# Patient Record
Sex: Male | Born: 1950 | Race: White | Hispanic: No | Marital: Married | Smoking: Former smoker
Health system: Southern US, Community
[De-identification: ages and names within clinical notes are randomized; demographics above are authoritative.]

## PROBLEM LIST (undated history)

## (undated) DIAGNOSIS — N400 Enlarged prostate without lower urinary tract symptoms: Secondary | ICD-10-CM

## (undated) DIAGNOSIS — K648 Other hemorrhoids: Secondary | ICD-10-CM

## (undated) DIAGNOSIS — E7800 Pure hypercholesterolemia, unspecified: Secondary | ICD-10-CM

## (undated) DIAGNOSIS — E039 Hypothyroidism, unspecified: Secondary | ICD-10-CM

## (undated) DIAGNOSIS — E079 Disorder of thyroid, unspecified: Secondary | ICD-10-CM

## (undated) DIAGNOSIS — F419 Anxiety disorder, unspecified: Secondary | ICD-10-CM

## (undated) DIAGNOSIS — D126 Benign neoplasm of colon, unspecified: Secondary | ICD-10-CM

## (undated) HISTORY — PX: SIGMOIDOSCOPY: SUR1295

## (undated) HISTORY — PX: COLONOSCOPY: SHX174

## (undated) HISTORY — DX: Benign prostatic hyperplasia without lower urinary tract symptoms: N40.0

## (undated) HISTORY — PX: VASECTOMY: SHX75

## (undated) HISTORY — DX: Benign neoplasm of colon, unspecified: D12.6

## (undated) HISTORY — PX: TONSILLECTOMY: SUR1361

## (undated) HISTORY — DX: Anxiety disorder, unspecified: F41.9

## (undated) HISTORY — DX: Hypothyroidism, unspecified: E03.9

## (undated) HISTORY — DX: Other hemorrhoids: K64.8

---
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
## 2017-06-17 HISTORY — PX: HEMORRHOID BANDING: SHX5850

## 2019-06-12 ENCOUNTER — Other Ambulatory Visit: Payer: Self-pay

## 2019-06-12 ENCOUNTER — Ambulatory Visit (HOSPITAL_COMMUNITY)
Admission: EM | Admit: 2019-06-12 | Discharge: 2019-06-12 | Disposition: A | Discharge status: Home / Self Care | Payer: Medicare Other

## 2019-06-12 ENCOUNTER — Encounter (HOSPITAL_COMMUNITY): Payer: Self-pay | Admitting: *Deleted

## 2019-06-12 DIAGNOSIS — W101XXA Fall (on)(from) sidewalk curb, initial encounter: Secondary | ICD-10-CM | POA: Diagnosis not present

## 2019-06-12 DIAGNOSIS — M79642 Pain in left hand: Secondary | ICD-10-CM

## 2019-06-12 DIAGNOSIS — S61412A Laceration without foreign body of left hand, initial encounter: Secondary | ICD-10-CM | POA: Diagnosis not present

## 2019-06-12 HISTORY — DX: Pure hypercholesterolemia, unspecified: E78.00

## 2019-06-12 NOTE — ED Triage Notes (Signed)
Reports tripping on curb 12/24, causing laceration to left hand.  States has kept original dressing in place since incident.  No active bleeding.  No S/S infection.  Left hand CMS intact.

## 2019-06-12 NOTE — ED Provider Notes (Signed)
[place]   MRN: [id number] DOB: 1951-04-26  Subjective:   Rodney Mcdaniel is a 68 y.o. male presenting for suffering a left hand laceration.  Patient states that he tripped on a curb accidentally on 06/10/2019.  He cannot recall how he injured his hand but suffered a laceration.  He has been using Steri-Strips and keeping his hand very clean.  Unfortunately he cannot stop the bleeding and feels like the laceration keeps coming apart.  Reports that his Tdap was updated 1 year ago.  Has not been taking anything for pain.  Denies fever, redness, swelling, drainage of pus, loss of range of motion.  No current facility-administered medications for this encounter.  Current Outpatient Medications:  .  ATORVASTATIN CALCIUM PO, Take by mouth., Disp: , Rfl:  .  LEVOTHYROXINE SODIUM PO, Take by mouth., Disp: , Rfl:    No Known Allergies  Past Medical History:  Diagnosis Date  . Hypercholesteremia   . Thyroid disease      History reviewed. No pertinent surgical history.  Family History  Problem Relation Age of Onset  . Heart disease Mother   . Heart disease Father     Social History   Tobacco Use  . Smoking status: Former Research scientist (life sciences)  . Smokeless tobacco: Never Used  Substance Use Topics  . Alcohol use: Yes    Comment: 3-4 drinks per week  . Drug use: Never    ROS   Objective:   Vitals: BP (!) 169/89   Pulse 66   Temp 97.8 F (36.6 C) (Oral)   Resp 18   SpO2 95%   Physical Exam Constitutional:      General: He is not in acute distress.    Appearance: Normal appearance. He is well-developed and normal weight. He is not ill-appearing, toxic-appearing or diaphoretic.  HENT:     Head: Normocephalic and atraumatic.     Right Ear: External ear normal.     Left Ear: External ear normal.     Nose: Nose normal.     Mouth/Throat:     Pharynx: Oropharynx is clear.  Eyes:     General: No scleral icterus.       Right eye: No discharge.        Left eye: No discharge.      Extraocular Movements: Extraocular movements intact.     Pupils: Pupils are equal, round, and reactive to light.  Cardiovascular:     Rate and Rhythm: Normal rate.  Pulmonary:     Effort: Pulmonary effort is normal.  Musculoskeletal:     Left hand: Laceration and tenderness (minimal about his wound only) present. No swelling, deformity or bony tenderness. Normal range of motion. Normal strength. Normal sensation. Normal capillary refill.       Arms:     Cervical back: Normal range of motion.  Neurological:     Mental Status: He is alert and oriented to person, place, and time.  Psychiatric:        Mood and Affect: Mood normal.        Behavior: Behavior normal.        Thought Content: Thought content normal.        Judgment: Judgment normal.     PROCEDURE NOTE: laceration repair Verbal consent obtained from patient.  Local anesthesia with 4cc Lidocaine 2% with epinephrine.  Wound explored for tendon, ligament damage. Wound scrubbed with soap and water and rinsed. Wound closed with #3 4-0 Prolene (loosely approximated with simple interrupted) sutures.  Wound cleansed and dressed.   Assessment and Plan :   1. Laceration of left hand without foreign body, initial encounter   2. Left hand pain     Laceration loosely approximated successfully. Wound care reviewed.  Patient is to return in 2 to 3 days for wound check unless signs and symptoms of infection develop.  No need to update Tdap today.  Remove sutures in about 10 days.  Counseled patient on potential for adverse effects with medications prescribed/recommended today, ER and return-to-clinic precautions discussed, patient verbalized understanding.    Jaynee Eagles, PA-C 06/12/19 1251

## 2019-06-12 NOTE — Discharge Instructions (Signed)
For tonight, change your dressing before going to bed.  Use nonadherent dressing with coband as provided.  Please change your dressing 2-3 times daily until you are seen again in 2 to 3 days. Do not apply any ointments or creams. Each time you change your dressing, make sure you clean gently around the perimeter of the wound with gentle soap and warm water. Pat your wound dry and let it air out if possible for 1-2 hours before reapplying another dressing.  If you develop redness, swelling, drainage of pus or worsening pain then please come back to the clinic as soon as possible as this is a sign of an infected wound.  You may take Tylenol at 500 mg to 650 mg once every 6-8 hours for pain.

## 2019-06-14 ENCOUNTER — Other Ambulatory Visit: Payer: Self-pay

## 2019-06-14 ENCOUNTER — Encounter (HOSPITAL_COMMUNITY): Payer: Self-pay

## 2019-06-14 ENCOUNTER — Ambulatory Visit (HOSPITAL_COMMUNITY)
Admission: EM | Admit: 2019-06-14 | Discharge: 2019-06-14 | Disposition: A | Discharge status: Home / Self Care | Payer: Medicare Other | Attending: Family Medicine | Admitting: Family Medicine

## 2019-06-14 DIAGNOSIS — Z5189 Encounter for other specified aftercare: Secondary | ICD-10-CM

## 2019-06-14 NOTE — ED Triage Notes (Signed)
Pt. States he is following up for his left hand wound from 06/12/2019.

## 2019-06-14 NOTE — ED Notes (Signed)
Bed: UC04 Expected date:  Expected time:  Means of arrival:  Comments: 10:00 Appt

## 2019-06-14 NOTE — ED Provider Notes (Signed)
[place]    CSN: [id number] Arrival date & time: 06/14/19  [id number]      History   Chief Complaint Chief Complaint  Patient presents with  . Appointment    HAND WOUND(LEFT)    HPI Rodney Mcdaniel is a 68 y.o. male.   HPI  Here for follow up  hand laceration No complaint Keeping covered No redness, pain or drainage  Past Medical History:  Diagnosis Date  . Hypercholesteremia   . Thyroid disease     There are no problems to display for this patient.   History reviewed. No pertinent surgical history.     Home Medications    Prior to Admission medications   Medication Sig Start Date End Date Taking? Authorizing Provider  ATORVASTATIN CALCIUM PO Take by mouth.    [provider]  LEVOTHYROXINE SODIUM PO Take by mouth.    [provider]    Family History Family History  Problem Relation Age of Onset  . Heart disease Mother   . Heart disease Father     Social History Social History   Tobacco Use  . Smoking status: Former Research scientist (life sciences)  . Smokeless tobacco: Never Used  Substance Use Topics  . Alcohol use: Yes    Comment: 3-4 drinks per week  . Drug use: Never     Allergies   Patient has no known allergies.   Review of Systems Review of Systems  Constitutional: Negative for chills and fever.  HENT: Negative for congestion and hearing loss.   Eyes: Negative for pain.  Respiratory: Negative for cough and shortness of breath.   Cardiovascular: Negative for chest pain and leg swelling.  Gastrointestinal: Negative for abdominal pain, constipation and diarrhea.  Genitourinary: Negative for dysuria and frequency.  Musculoskeletal: Negative for myalgias.  Skin: Positive for wound.  Neurological: Negative for dizziness, seizures and headaches.  Psychiatric/Behavioral: The patient is not nervous/anxious.      Physical Exam Triage Vital Signs ED Triage Vitals  Enc Vitals Group     BP 06/14/19 1012 (!) 156/90     Pulse Rate  06/14/19 1012 86     Resp 06/14/19 1012 19     Temp 06/14/19 1012 (!) 97.5 F (36.4 C)     Temp Source 06/14/19 1012 Oral     SpO2 06/14/19 1012 93 %     Weight 06/14/19 1011 226 lb 3.2 oz (102.6 kg)     Height --      Head Circumference --      Peak Flow --      Pain Score 06/14/19 1010 2     Pain Loc --      Pain Edu? --      Excl. in [place]? --    No data found.  Updated Vital Signs BP (!) 156/90 (BP Location: Left Arm)   Pulse 86   Temp (!) 97.5 F (36.4 C) (Oral)   Resp 19   Wt 102.6 kg   SpO2 93%      Physical Exam Constitutional:      General: He is not in acute distress.    Appearance: He is well-developed.  HENT:     Head: Normocephalic and atraumatic.  Eyes:     Conjunctiva/sclera: Conjunctivae normal.     Pupils: Pupils are equal, round, and reactive to light.  Cardiovascular:     Rate and Rhythm: Normal rate.  Pulmonary:     Effort: Pulmonary effort is normal. No respiratory distress.  Abdominal:  General: There is no distension.     Palpations: Abdomen is soft.  Musculoskeletal:        General: Normal range of motion.     Cervical back: Normal range of motion.  Skin:    General: Skin is warm and dry.     Comments: Dorsum of the left hand has healing laceration , dry, intact  Neurological:     Mental Status: He is alert.      UC Treatments / Results  Labs (all labs ordered are listed, but only abnormal results are displayed) Labs Reviewed - No data to display  EKG   Radiology No results found.  Procedures Procedures (including critical care time)  Medications Ordered in UC Medications - No data to display  Initial Impression / Assessment and Plan / UC Course  I have reviewed the triage vital signs and the nursing notes.  Pertinent labs & imaging results that were available during my care of the patient were reviewed by me and considered in my medical decision making (see chart for details).     Discussed wound care and  expectations. Final Clinical Impressions(s) / UC Diagnoses   Final diagnoses:  Visit for wound check   Discharge Instructions   None    ED Prescriptions    None     PDMP not reviewed this encounter.   Raylene Everts, MD 06/14/19 [phone]

## 2019-12-16 ENCOUNTER — Encounter: Payer: Self-pay | Admitting: Podiatry

## 2019-12-16 ENCOUNTER — Ambulatory Visit: Payer: Medicare Other | Admitting: Podiatry

## 2019-12-16 ENCOUNTER — Other Ambulatory Visit: Payer: Self-pay

## 2019-12-16 VITALS — BP 165/93 | HR 67

## 2019-12-16 DIAGNOSIS — M6740 Ganglion, unspecified site: Secondary | ICD-10-CM | POA: Diagnosis not present

## 2019-12-16 DIAGNOSIS — M205X2 Other deformities of toe(s) (acquired), left foot: Secondary | ICD-10-CM

## 2019-12-16 NOTE — Progress Notes (Signed)
  Subjective:  Patient ID: Rodney Mcdaniel, male    DOB: May 10, 1951,  MRN: [id number]  Chief Complaint  Patient presents with  . Cyst    possible cyst left 3rd toe- he has had it for about a year, it drained the other day after playing golf. Wants to know what it is and if it's concerning    69 y.o. male presents with the above complaint. The history was reviewed and confirmed with the patient.  Really only bothersome in tighter closed shoes.  Does not recall what kind drainage came out of it.  Thinks it was a clear jellylike fluid.  Saw his PCP and a dermatologist who thought it was some sort of a cyst.  He also inquires if his second toe is longer than the others microstent with at the end.  Review of Systems: Negative except as noted in the HPI. Denies N/V/F/Ch. Objective:   Vitals:   12/16/19 1108  BP: (!) 165/93  Pulse: 67    Lower extremity focused examination: Palpable pedal pulses, sensation intact to light touch.  Mallet toe deformity of toes 2 through 4 bilaterally.  The left third toe has a small palpable soft mass with slight scaling and crust dorsally.  No clear evidence of vascular involvement.   Assessment & Plan:   Encounter Diagnoses  Name Primary?  . Mucoid cyst of joint Yes  . Mallet toe of left foot     Patient was evaluated and treated and all questions answered.  Discussed etiology and treatment of mucoid cyst.  He said this is only intermittently bothersome for him.  I gave him a silicone toe To wear while he is in tighter shoes such as golf shoes so does not rub and irritate.  If this other becomes painful or bothersome for him we will consider excision.  Also discussed mallet toe formation and that if this is ever painful or bothersome rubbing into the hallux or the tip of the toe then we can address this as well.  Lanae Crumbly, DPM 12/16/2019    Return if symptoms worsen or fail to improve if you want the cyst removed.

## 2020-01-26 ENCOUNTER — Encounter: Payer: Self-pay | Admitting: Internal Medicine

## 2020-01-31 ENCOUNTER — Other Ambulatory Visit: Payer: Self-pay

## 2020-01-31 ENCOUNTER — Other Ambulatory Visit: Payer: Medicare Other

## 2020-01-31 DIAGNOSIS — Z20822 Contact with and (suspected) exposure to covid-19: Secondary | ICD-10-CM

## 2020-02-01 LAB — SARS-COV-2, NAA 2 DAY TAT

## 2020-02-01 LAB — NOVEL CORONAVIRUS, NAA: SARS-CoV-2, NAA: NOT DETECTED

## 2020-04-04 ENCOUNTER — Ambulatory Visit (INDEPENDENT_AMBULATORY_CARE_PROVIDER_SITE_OTHER): Payer: Medicare Other | Admitting: Internal Medicine

## 2020-04-04 VITALS — BP 144/92 | HR 67 | Ht 71.0 in | Wt 220.5 lb

## 2020-04-04 DIAGNOSIS — K641 Second degree hemorrhoids: Secondary | ICD-10-CM | POA: Insufficient documentation

## 2020-04-04 DIAGNOSIS — R151 Fecal smearing: Secondary | ICD-10-CM | POA: Diagnosis not present

## 2020-04-04 NOTE — Progress Notes (Signed)
Rodney Mcdaniel 69 y.o. Oct 15, 1950 [id number]  Assessment & Plan:   Encounter Diagnoses  Name Primary?  . Prolapsed internal hemorrhoids, grade 2 Yes  . Fecal smearing     Seems like he is a reasonable candidate for repeat hemorrhoidal ligation.  We will schedule this for November at his convenience.  Band from 1-3 hemorrhoids depending upon tolerance and need at the time.  For completeness we will get a copy of his colonoscopy report from September 2019. Obtained, 6 mm colon polyp and diverticulosis and hemorrhoids we will scan it  I appreciate the opportunity to care for this patient. CC: [name], MD   Subjective:   Chief Complaint: Hemorrhoids and fecal smearing  HPI The patient is a 69 year old white man sent by Dr. [name] because of fecal leakage and history of hemorrhoids.  The patient was seen at [facility] by Dr. Alan Ripper in 2019 colonoscopy demonstrated a small tubular adenoma (pathology report seen for colonoscopy report not available), and he had internal hemorrhoids which were banded x2 each time with a flexible sigmoidoscopy and banding ligator apparatus attached in November last.  Since that time in 2019 he noted improvement he did not go back for third banding as recommended (1 hemorrhoid columns banded each time with the sigmoidoscope).  His symptoms are that of intermittent fecal smearing in the gluteal crease and onto the underwear that interferes with his hobby of hiking.  It is a nuisance.  There is no bleeding there are no symptoms of prolapse.  He does take Benefiber 1 or 2 doses a day stools are variable and when stools are softer is when he has these problems.  He has a history of sometimes when feeling stressed having softer or looser stools.  He does not have frank diarrhea.  Does not describe abdominal pain or other bowel habit changes.  He does not seem to have prolonged toileting.  The more formed the stool the last problem he has with his  fecal smearing. No Known Allergies Current Meds  Medication Sig  . ALPRAZolam (XANAX) 0.5 MG tablet Take 0.5 mg by mouth daily as needed.  [name] atorvastatin (LIPITOR) 20 MG tablet Take 20 mg by mouth daily.  [name] levothyroxine (SYNTHROID) 112 MCG tablet Take 112 mcg by mouth daily before breakfast.    Past Medical History:  Diagnosis Date  . Adenomatous colon polyp   . Anxiety   . BPH (benign prostatic hyperplasia)   . Hypercholesteremia   . Hypothyroidism   . Internal hemorrhoid    Past Surgical History:  Procedure Laterality Date  . COLONOSCOPY    . HEMORRHOID BANDING  2019   Via sigmoidoscopy  . SIGMOIDOSCOPY    . TONSILLECTOMY     as a child  . 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   Review of Systems As per HPI some lower back stiffness and some nocturia all other review of systems are negative  Objective:   Physical Exam BP (!) 144/92 (BP Location: Left Arm, Patient Position: Sitting, Cuff Size: Large)   Pulse 67   Ht 5\' 11"  (1.803 m)   Wt 220 lb 8 oz (100 kg)  SpO2 98%   BMI 30.75 kg/m  Well-developed well-nourished white man no acute distress  Inspection of the rectal area shows relatively normal anoderm with just some slight pink discoloration of skin in the gluteal crease though there is a small amount of feces right at the anus  Normal anal tone no mass soft formed brown stool present   Anoscopic exam is performed and shows grade 2 prolapsed internal hemorrhoids in all positions right posterior probably the most prominent

## 2020-04-04 NOTE — Patient Instructions (Addendum)
Normal BMI (Body Mass Index- based on height and weight) is between 23 and 30. Your BMI today is Body mass index is 30.75 kg/m. [name] Please consider follow up  regarding your BMI with your Primary Care Provider.  Due to recent changes in healthcare laws, you may see the results of your imaging and laboratory studies on MyChart before your provider has had a chance to review them.  We understand that in some cases there may be results that are confusing or concerning to you. Not all laboratory results come back in the same time frame and the provider may be waiting for multiple results in order to interpret others.  Please give [place] 48 hours in order for your provider to thoroughly review all the results before contacting the office for clarification of your results.   We will obtain your colonoscopy report for review.   We will see you back for hemorrhoid banding on November 19th at 2:50pm.   I appreciate the opportunity to care for you. Silvano Rusk, MD, [facility]

## 2020-05-05 ENCOUNTER — Ambulatory Visit: Payer: Medicare Other | Admitting: Internal Medicine

## 2020-05-05 ENCOUNTER — Encounter: Payer: Self-pay | Admitting: Internal Medicine

## 2020-05-05 VITALS — BP 130/80 | HR 64 | Ht 70.5 in | Wt 220.0 lb

## 2020-05-05 DIAGNOSIS — K641 Second degree hemorrhoids: Secondary | ICD-10-CM

## 2020-05-05 NOTE — Progress Notes (Signed)
° °  HEMORRHOID LIGATION: Symptoms are fecal smearing/leakage  PROCEDURE NOTE: The patient presents with symptomatic grade 2  hemorrhoids, requesting rubber band ligation of his/her hemorrhoidal disease.  All risks, benefits and alternative forms of therapy were described and informed consent was obtained.   The anorectum was pre-medicated with 0.125% nitroglycerin and 5% lidocaine topical The decision was made to band the all 3 internal hemorrhoid columns, and the [place] was used to perform band ligation without complication.  Digital anorectal examination was then performed to assure proper positioning of the band, and to adjust the banded tissue as required.  The patient was discharged home without pain or other issues.  Dietary and behavioral recommendations were given and along with follow-up instructions.     The following adjunctive treatments were recommended:  Continue Benefiber  The patient will return in 2 months for  follow-up and possible additional banding as required. No complications were encountered and the patient tolerated the procedure well.  I appreciate the opportunity to care for this patient. CC: [name], MD

## 2020-05-05 NOTE — Assessment & Plan Note (Signed)
All 3 internal hemorrhoid columns banded today.  Reassess in 2 months.

## 2020-05-05 NOTE — Patient Instructions (Signed)
HEMORRHOID BANDING PROCEDURE    FOLLOW-UP CARE   1. The procedure you have had should have been relatively painless since the banding of the area involved does not have nerve endings and there is no pain sensation.  The rubber band cuts off the blood supply to the hemorrhoid and the band may fall off as soon as 48 hours after the banding (the band may occasionally be seen in the toilet bowl following a bowel movement). You may notice a temporary feeling of fullness in the rectum which should respond adequately to plain Tylenol or Motrin.  2. Following the banding, avoid strenuous exercise that evening and resume full activity the next day.  A sitz bath (soaking in a warm tub) or bidet is soothing, and can be useful for cleansing the area after bowel movements.     3. To avoid constipation, take two tablespoons of natural wheat bran, natural oat bran, flax, Benefiber or any over the counter fiber supplement and increase your water intake to 7-8 glasses daily.    4. Unless you have been prescribed anorectal medication, do not put anything inside your rectum for two weeks: No suppositories, enemas, fingers, etc.  5. Occasionally, you may have more bleeding than usual after the banding procedure.  This is often from the untreated hemorrhoids rather than the treated one.  Don't be concerned if there is a tablespoon or so of blood.  If there is more blood than this, lie flat with your bottom higher than your head and apply an ice pack to the area. If the bleeding does not stop within a half an hour or if you feel faint, call our office at [phone] or go to the emergency room.  6. Problems are not common; however, if there is a substantial amount of bleeding, severe pain, chills, fever or difficulty passing urine (very rare) or other problems, you should call us at [phone] or report to the nearest emergency room.  7. Do not stay seated continuously for more than 2-3 hours for a day or two  after the procedure.  Tighten your buttock muscles 10-15 times every two hours and take 10-15 deep breaths every 1-2 hours.  Do not spend more than a few minutes on the toilet if you cannot empty your bowel; instead re-visit the toilet at a later time.    Follow up with [place] in January 2022.    I appreciate the opportunity to care for you. Silvano Rusk, MD, [facility]

## 2020-07-06 ENCOUNTER — Encounter: Payer: Self-pay | Admitting: Internal Medicine

## 2020-07-06 ENCOUNTER — Ambulatory Visit: Payer: Medicare Other | Admitting: Internal Medicine

## 2020-07-06 VITALS — BP 124/78 | HR 80 | Ht 71.0 in | Wt 222.0 lb

## 2020-07-06 DIAGNOSIS — K641 Second degree hemorrhoids: Secondary | ICD-10-CM | POA: Diagnosis not present

## 2020-07-06 DIAGNOSIS — R151 Fecal smearing: Secondary | ICD-10-CM | POA: Diagnosis not present

## 2020-07-06 NOTE — Patient Instructions (Signed)
HEMORRHOID BANDING PROCEDURE    FOLLOW-UP CARE   1. The procedure you have had should have been relatively painless since the banding of the area involved does not have nerve endings and there is no pain sensation.  The rubber band cuts off the blood supply to the hemorrhoid and the band may fall off as soon as 48 hours after the banding (the band may occasionally be seen in the toilet bowl following a bowel movement). You may notice a temporary feeling of fullness in the rectum which should respond adequately to plain Tylenol or Motrin.  2. Following the banding, avoid strenuous exercise that evening and resume full activity the next day.  A sitz bath (soaking in a warm tub) or bidet is soothing, and can be useful for cleansing the area after bowel movements.     3. To avoid constipation, take two tablespoons of natural wheat bran, natural oat bran, flax, Benefiber or any over the counter fiber supplement and increase your water intake to 7-8 glasses daily.    4. Unless you have been prescribed anorectal medication, do not put anything inside your rectum for two weeks: No suppositories, enemas, fingers, etc.  5. Occasionally, you may have more bleeding than usual after the banding procedure.  This is often from the untreated hemorrhoids rather than the treated one.  Don't be concerned if there is a tablespoon or so of blood.  If there is more blood than this, lie flat with your bottom higher than your head and apply an ice pack to the area. If the bleeding does not stop within a half an hour or if you feel faint, call our office at [phone] or go to the emergency room.  6. Problems are not common; however, if there is a substantial amount of bleeding, severe pain, chills, fever or difficulty passing urine (very rare) or other problems, you should call us at [phone] or report to the nearest emergency room.  7. Do not stay seated continuously for more than 2-3 hours for a day or two  after the procedure.  Tighten your buttock muscles 10-15 times every two hours and take 10-15 deep breaths every 1-2 hours.  Do not spend more than a few minutes on the toilet if you cannot empty your bowel; instead re-visit the toilet at a later time.    Cut back to one tablespoon of Benefiber next week and see how you do. Adjust as needed.   Follow up with Dr Carlean Purl in 2 months.   I appreciate the opportunity to care for you. Silvano Rusk, MD, [facility]

## 2020-07-06 NOTE — Assessment & Plan Note (Signed)
All 3 banded RTC 2 mos Reduce benefiber to 1 tbsp/day

## 2020-07-06 NOTE — Progress Notes (Signed)
   Rodney Mcdaniel 70 y.o. [birth year] [id number]  Assessment & Plan:   Encounter Diagnoses  Name Primary?  . Prolapsed internal hemorrhoids, grade 2 Yes  . Fecal smearing      Prolapsed internal hemorrhoids, grade 2 All 3 banded RTC 2 mos Reduce benefiber to 1 tbsp/day   Subjective:   Chief Complaint:  HPI  No Known Allergies Current Meds  Medication Sig  . ALPRAZolam (XANAX) 0.5 MG tablet Take 0.5 mg by mouth daily as needed.  [name] atorvastatin (LIPITOR) 20 MG tablet Take 20 mg by mouth daily.  [name] levothyroxine (SYNTHROID) 112 MCG tablet Take 112 mcg by mouth daily before breakfast.    Past Medical History:  Diagnosis Date  . Adenomatous colon polyp   . Anxiety   . BPH (benign prostatic hyperplasia)   . Hypercholesteremia   . Hypothyroidism   . Internal hemorrhoid    Past Surgical History:  Procedure Laterality Date  . COLONOSCOPY    . HEMORRHOID BANDING  2019   Via sigmoidoscopy  . SIGMOIDOSCOPY    . TONSILLECTOMY     as a child  . VASECTOMY     Social History   Social History Narrative   Retired Theatre manager   1 son 1 daughter   Married to [place]   Former smoker half to 1 alcoholic drink a day 1 caffeinated beverage a day no tobacco or drug use at this time   family history includes Heart disease in his father and mother.   Review of Systems As above  Objective:   Physical Exam BP 124/78   Pulse 80   Ht 5\' 11"  (1.803 m)   Wt 222 lb (100.7 kg)   BMI 30.96 kg/m   NAD  Rectal -  Anoderm with thin coat of brown feces, no mass nontender NL tone  Anoscopy Gr 2 internal hemorrhoids all positions  PROCEDURE NOTE: The patient presents with symptomatic grade 2  hemorrhoids, requesting rubber band ligation of his/her hemorrhoidal disease.  All risks, benefits and alternative forms of therapy were described and informed consent was obtained.   The anorectum was pre-medicated with 0.125% NTG and 5% lidocaine The decision was made to band the  all internal hemorrhoids, and the [place] was used to perform band ligation without complication.  Digital anorectal examination was then performed to assure proper positioning of the band, and to adjust the banded tissue as required.  The patient was discharged home without pain or other issues.  Dietary and behavioral recommendations were given and along with follow-up instructions.

## 2020-09-06 ENCOUNTER — Ambulatory Visit: Payer: Medicare Other | Admitting: Internal Medicine

## 2020-10-02 ENCOUNTER — Ambulatory Visit: Payer: Medicare Other | Admitting: Internal Medicine

## 2020-11-01 ENCOUNTER — Encounter: Payer: Self-pay | Admitting: Internal Medicine

## 2020-11-01 ENCOUNTER — Other Ambulatory Visit: Payer: Medicare Other

## 2020-11-01 ENCOUNTER — Ambulatory Visit: Payer: Medicare Other | Admitting: Internal Medicine

## 2020-11-01 VITALS — BP 130/80 | HR 64 | Ht 71.0 in | Wt 215.0 lb

## 2020-11-01 DIAGNOSIS — R195 Other fecal abnormalities: Secondary | ICD-10-CM

## 2020-11-01 DIAGNOSIS — K641 Second degree hemorrhoids: Secondary | ICD-10-CM

## 2020-11-01 DIAGNOSIS — R151 Fecal smearing: Secondary | ICD-10-CM

## 2020-11-01 NOTE — Patient Instructions (Signed)
Your provider has requested that you go to the basement level for lab work before leaving today. Press "B" on the elevator. The lab is located at the first door on the left as you exit the elevator.  Due to recent changes in healthcare laws, you may see the results of your imaging and laboratory studies on MyChart before your provider has had a chance to review them.  We understand that in some cases there may be results that are confusing or concerning to you. Not all laboratory results come back in the same time frame and the provider may be waiting for multiple results in order to interpret others.  Please give us 48 hours in order for your provider to thoroughly review all the results before contacting the office for clarification of your results.   You have been given a testing kit to check for small intestine bacterial overgrowth (SIBO) which is completed by a company named [facility]. Make sure to return your test in the mail using the return mailing label given to you along with the kit. Your demographic and insurance information have already been sent to the company and they should be in contact with you over the next week regarding this test. [facility] will collect an upfront charge of $99.74 for commercial insurance plans and $209.74 is you are paying cash. Make sure to discuss with [facility] PRIOR to having the test if they have gotten informatoin from your insurance company as to how much your testing will cost out of pocket, if any. Please keep in mind that you will be getting a call from phone number [phone] or a similar number. If you do not hear from them within this time frame, please call our office at [phone].    I appreciate the opportunity to care for you. Carl Gessner, MD, FACG 

## 2020-11-02 ENCOUNTER — Other Ambulatory Visit: Payer: Medicare Other

## 2020-11-02 DIAGNOSIS — R195 Other fecal abnormalities: Secondary | ICD-10-CM

## 2020-11-02 NOTE — Progress Notes (Signed)
   Rodney Mcdaniel 70 y.o. 1951-04-14 [id number]  Assessment & Plan:   Encounter Diagnoses  Name Primary?  . Loose stools Yes  . Fecal smearing   . Prolapsed internal hemorrhoids, grade 2     I have been treating prolapsed hemorrhoids thinking that the cause of the fecal smearing but now I am thinking this may be more of an issue related to stool consistency.  I have not made him better.  We will do lab testing as below as well as a lactulose hydrogen breath test.  Further plans pending these results.  Orders Placed This Encounter  Procedures  . Calprotectin, Fecal  . Pancreatic elastase, fecal   I had also intended to tell him about toilet paper spray that can be used to improve cleansing and I will send him a MyChart message about that. I appreciate the opportunity to care for this patient. CC: Rodney Pretty, MD   Subjective:   Chief Complaint: Fecal smearing and hemorrhoids  HPI Rodney Mcdaniel returns after several banding's of grade 2 internal hemorrhoids to try to treat fecal smearing but this is really not helped him.  If he has a really good formed stool greater than Bristol stool scale 4 i.e. smaller number then he will be okay without seepage, if he does not have a bowel movement "it is my best day".  However when he is having looser stools Bristol stool scale 4 maybe 5 he will have difficulty cleansing and he may have seepage into the underwear.  He wears a pad frequently.  He does note that when he works out at [employer] he will have some leakage but then once he cleans up after that no more problems. No Known Allergies Current Meds  Medication Sig  . ALPRAZolam (XANAX) 0.5 MG tablet Take 0.5 mg by mouth daily as needed.  [name] atorvastatin (LIPITOR) 20 MG tablet Take 20 mg by mouth daily.  [name] levothyroxine (SYNTHROID) 112 MCG tablet Take 112 mcg by mouth daily before breakfast.    Past Medical History:  Diagnosis Date  . Adenomatous colon polyp   . Anxiety   . BPH (benign  prostatic hyperplasia)   . Hypercholesteremia   . Hypothyroidism   . Internal hemorrhoid    Past Surgical History:  Procedure Laterality Date  . COLONOSCOPY    . HEMORRHOID BANDING  2019   Via sigmoidoscopy  . SIGMOIDOSCOPY    . TONSILLECTOMY     as a child  . 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   Review of Systems As above  Objective:   Physical Exam BP 130/80   Pulse 64   Ht 5\' 11"  (1.803 m)   Wt 215 lb (97.5 kg)   BMI 29.99 kg/m  Rectal exam reveals normal anoderm normal resting tone no mass  Anoscopy is performed he still has hemorrhoids probably grade 1 at this point internal although positions.  There is tan semiformed stool present not liquid at all.

## 2020-11-07 ENCOUNTER — Encounter: Payer: Self-pay | Admitting: Internal Medicine

## 2020-11-07 LAB — CALPROTECTIN, FECAL: Calprotectin, Fecal: 40 ug/g (ref 0–120)

## 2020-11-09 LAB — PANCREATIC ELASTASE, FECAL: Pancreatic Elastase-1, Stool: >500 mcg/g

## 2020-11-15 ENCOUNTER — Other Ambulatory Visit: Payer: Self-pay | Admitting: Internal Medicine

## 2020-11-15 ENCOUNTER — Encounter: Payer: Self-pay | Admitting: Internal Medicine

## 2020-11-15 DIAGNOSIS — K638219 Small intestinal bacterial overgrowth, unspecified: Secondary | ICD-10-CM

## 2020-11-15 DIAGNOSIS — K6389 Other specified diseases of intestine: Secondary | ICD-10-CM

## 2020-11-15 HISTORY — DX: Other specified diseases of intestine: K63.89

## 2020-11-15 HISTORY — DX: Small intestinal bacterial overgrowth, unspecified: K63.8219

## 2020-11-15 MED ORDER — RIFAXIMIN 550 MG PO TABS: 550.0000 mg | ORAL_TABLET | Freq: Three times a day (TID) | ORAL | 0 refills | Status: DC

## 2020-11-16 ENCOUNTER — Telehealth: Payer: Self-pay

## 2020-11-16 NOTE — Telephone Encounter (Signed)
I have started a prior authorization for patient's xifaxan, phone # [phone], case # [id number]. We will get the outcome by 11/19/2020. Patient informed of this being started.

## 2020-11-20 NOTE — Telephone Encounter (Signed)
The xifaxan has been approved thru 11/16/2021 and I informed [facility] and also Mr Granquist.

## 2020-11-21 NOTE — Telephone Encounter (Signed)
Patient called regarding Xifaxan and said [facility] sent him a text that they need more information.

## 2020-11-21 NOTE — Telephone Encounter (Signed)
I tried to call [facility] and they are closed for lunch. I called and left voice mail for Mr. Casique that I will try later today to see what is going on.

## 2020-11-21 NOTE — Telephone Encounter (Signed)
I called [facility] and they ran it thru fine , cost is $50.00 and they will get it ready for him. He said thank you.

## 2021-01-25 ENCOUNTER — Ambulatory Visit: Payer: Medicare Other | Admitting: Internal Medicine

## 2021-01-25 VITALS — BP 142/86 | HR 62 | Ht 71.0 in | Wt 214.4 lb

## 2021-01-25 DIAGNOSIS — R195 Other fecal abnormalities: Secondary | ICD-10-CM

## 2021-01-25 DIAGNOSIS — K6389 Other specified diseases of intestine: Secondary | ICD-10-CM

## 2021-01-25 DIAGNOSIS — R151 Fecal smearing: Secondary | ICD-10-CM

## 2021-01-25 NOTE — Patient Instructions (Addendum)
Glad things are improved.  As we discussed reduce processed food in diet.  Check out Metabolocal by Hali Marry.  Sometimes we need to retreat, so if the loose stools return let me know - can My Chart message me.  I appreciate the opportunity to care for you. Gatha Mayer, MD, [facility] Regal

## 2021-01-30 ENCOUNTER — Other Ambulatory Visit: Payer: Self-pay | Admitting: Internal Medicine

## 2021-01-30 DIAGNOSIS — E7800 Pure hypercholesterolemia, unspecified: Secondary | ICD-10-CM

## 2021-01-31 ENCOUNTER — Encounter: Payer: Self-pay | Admitting: Internal Medicine

## 2021-01-31 NOTE — Progress Notes (Signed)
   Rodney Mcdaniel 70 y.o. Apr 21, 1951 [id number]  Assessment & Plan:   Encounter Diagnoses  Name Primary?   Small intestinal bacterial overgrowth (SIBO) Yes   Loose stools    Fecal smearing      Overall much improved.  We talked about healthy eating and how processed foods are a major issue for many of [name] I think and that might help him more than the FODMAPs diet i.e. eliminating and reducing those.  I have recommended he read the book Metabolical.  He will see me as needed.  I appreciate the opportunity to care for this patient. CC: Rodney Pretty, MD   Subjective:   Chief Complaint: Fecal smearing hemorrhoids status post Xifaxan treatment  HPI Rodney Mcdaniel returns for follow-up of fecal smearing and hemorrhoid issues.  At his last visit in May I reconsidered things and thought perhaps his loose stools could be due to small intestinal bacterial overgrowth and he did have a positive study which I treated with Xifaxan.  He reports that the Xifaxan made a big difference in his stool consistency and he is not having the fecal smearing anymore.  He is quite pleased.  He remarks that it is difficult to follow a FODMAPs diet and I concur.  We talked a little bit about his insomnia for which he uses Xanax sometimes. No Known Allergies Current Meds  Medication Sig   ALPRAZolam (XANAX) 0.5 MG tablet Take 0.5 mg by mouth daily as needed.   atorvastatin (LIPITOR) 20 MG tablet Take 20 mg by mouth daily.   levothyroxine (SYNTHROID) 112 MCG tablet Take 112 mcg by mouth daily before breakfast.    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   Review of Systems As per HPI  Objective:   Physical Exam BP (!) 142/86 (BP Location: Left Arm, Patient Position: Sitting, Cuff Size: Normal)   Pulse 62   Ht '5\' 11"'$  (1.803 m)   Wt 214 lb 6 oz (97.2 kg)   SpO2 97%   BMI 29.90 kg/m

## 2021-02-16 ENCOUNTER — Ambulatory Visit
Admission: RE | Admit: 2021-02-16 | Discharge: 2021-02-16 | Disposition: A | Discharge status: Home / Self Care | Payer: No Typology Code available for payment source | Source: Ambulatory Visit | Attending: Internal Medicine | Admitting: Internal Medicine

## 2021-02-16 DIAGNOSIS — E7800 Pure hypercholesterolemia, unspecified: Secondary | ICD-10-CM

## 2021-02-28 ENCOUNTER — Other Ambulatory Visit: Payer: Self-pay | Admitting: Internal Medicine

## 2021-02-28 DIAGNOSIS — R911 Solitary pulmonary nodule: Secondary | ICD-10-CM

## 2022-02-05 ENCOUNTER — Other Ambulatory Visit: Payer: Self-pay | Admitting: Internal Medicine

## 2022-02-05 DIAGNOSIS — R911 Solitary pulmonary nodule: Secondary | ICD-10-CM

## 2022-02-20 ENCOUNTER — Other Ambulatory Visit: Payer: No Typology Code available for payment source

## 2022-03-18 ENCOUNTER — Other Ambulatory Visit (HOSPITAL_BASED_OUTPATIENT_CLINIC_OR_DEPARTMENT_OTHER): Payer: Self-pay

## 2022-03-18 MED ORDER — AREXVY 120 MCG/0.5ML IM SUSR
INTRAMUSCULAR | 0 refills | Status: AC
  Filled 2022-03-18: qty 0.5, 1d supply, fill #0

## 2022-03-25 ENCOUNTER — Other Ambulatory Visit: Payer: Medicare Other

## 2022-03-27 ENCOUNTER — Ambulatory Visit
Admission: RE | Admit: 2022-03-27 | Discharge: 2022-03-27 | Disposition: A | Discharge status: Home / Self Care | Payer: Medicare Other | Source: Ambulatory Visit | Attending: Internal Medicine | Admitting: Internal Medicine

## 2022-03-27 DIAGNOSIS — R911 Solitary pulmonary nodule: Secondary | ICD-10-CM

## 2022-05-08 IMAGING — CT CT CARDIAC CORONARY ARTERY CALCIUM SCORE
3 series · 14 of 20 positions shown, 16 images · non-contrast
Comparison: None.

CLINICAL DATA: 69-year-old Caucasian male with history of
hyperlipidemia and prior smoking history.

EXAM:
CT CARDIAC CORONARY ARTERY CALCIUM SCORE
TECHNIQUE: Non-contrast imaging through the heart was performed using
prospective ECG gating. Image post processing was performed on an
independent workstation, allowing for quantitative analysis of the
heart and coronary arteries. Note that this exam targets the heart
and the chest was not imaged in its entirety.

[Series 2: calcium scoring 2.00 qr36 bestdiast 73% hrt calciu · axial · 0.41mm/px · z∈[+1727,+1815]mm · 4 of 74 slices shown]
[im 15/74  vessel]
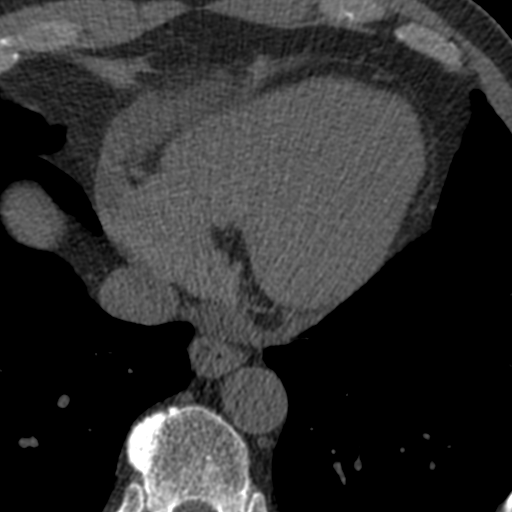
[im 30/74  vessel]
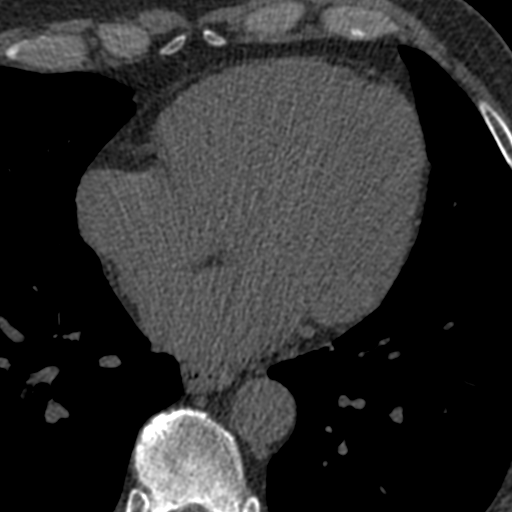
[im 44/74  vessel]
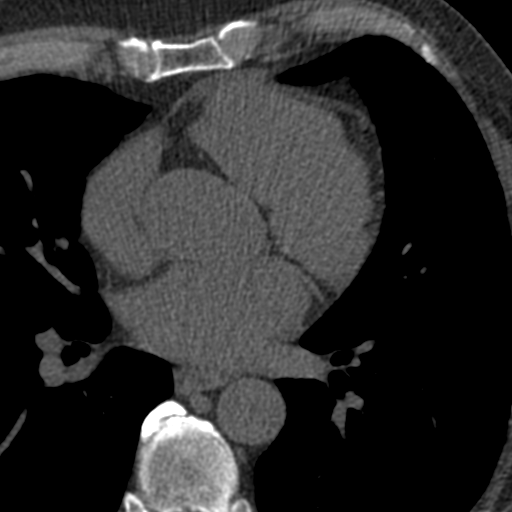
[im 59/74  vessel]
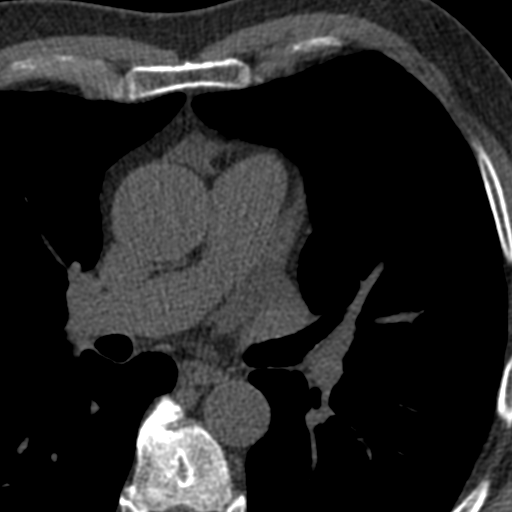

[Series 3: calcium scoring 2.00 br40 bestdiast 73% axial · axial · 0.59mm/px · z∈[+1723,+1819]mm · 5 of 74 slices shown, 7 images]
[im 13/74  vessel]
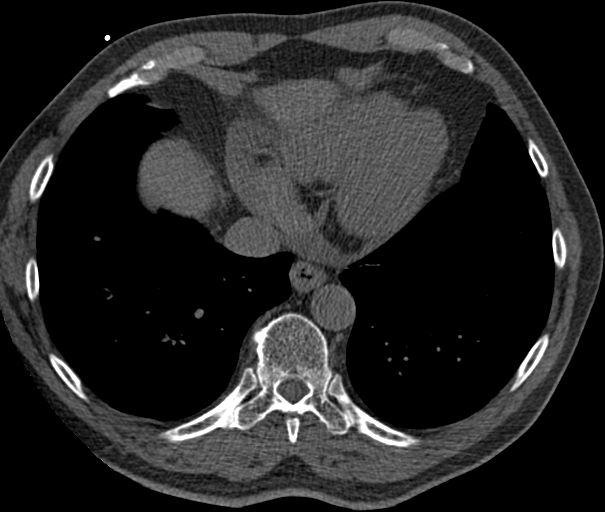
[im 13/74  lung]
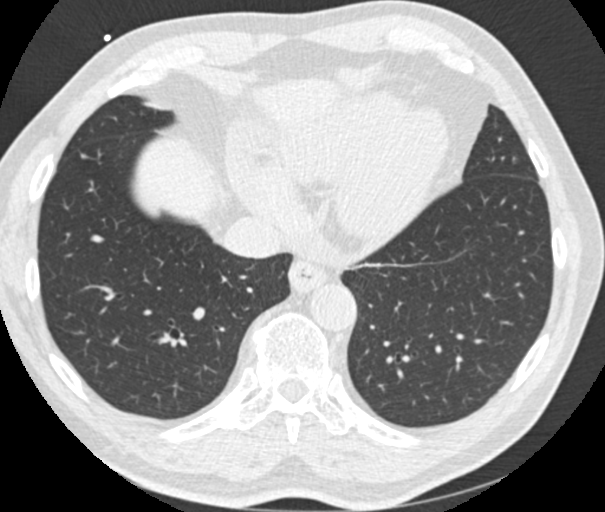
[im 25/74  vessel]
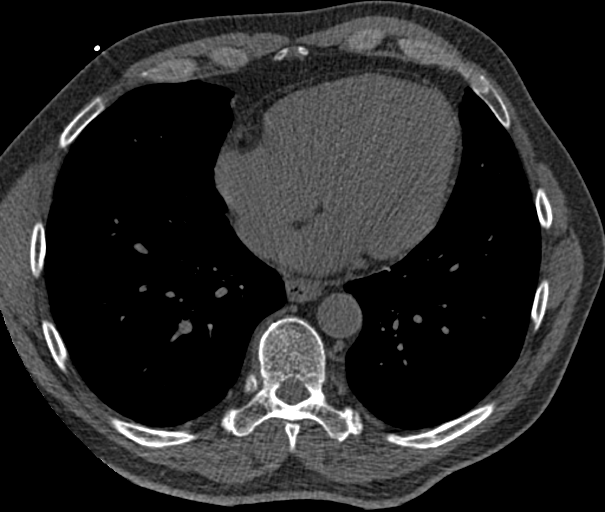
[im 37/74  vessel]
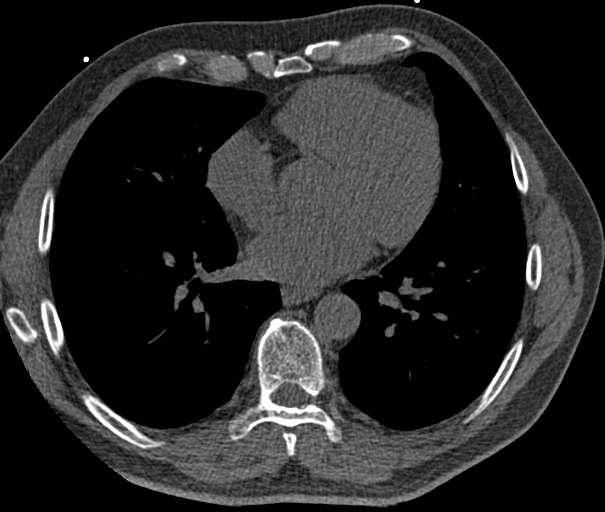
[im 49/74  vessel]
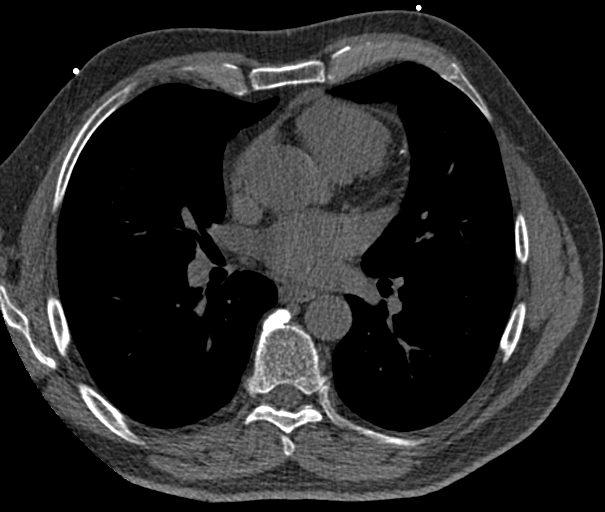
[im 61/74  vessel]
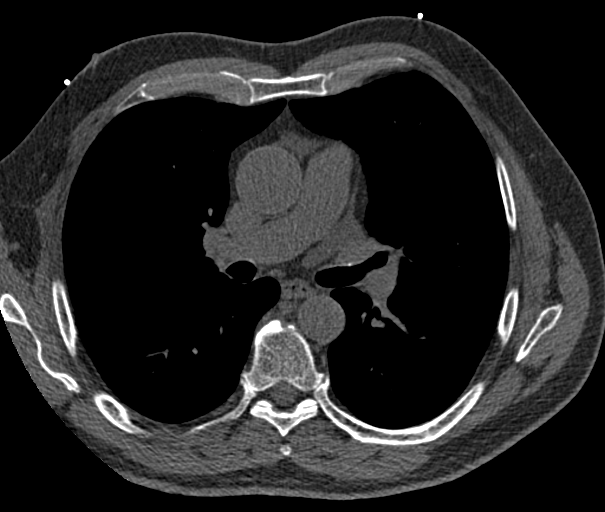
[im 61/74  lung]
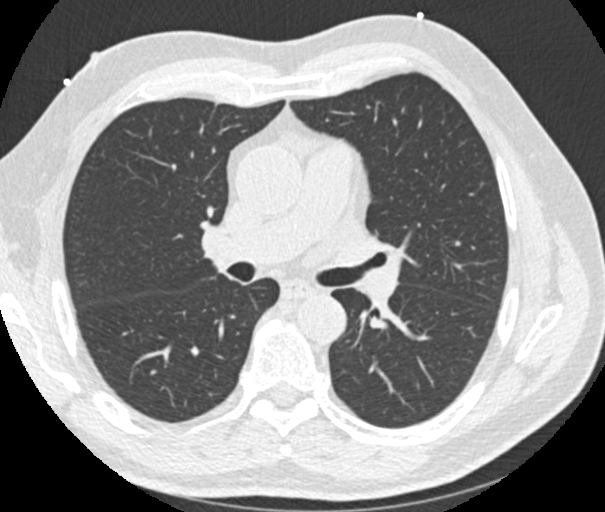

[Series 9: calcium scoring 2.00 br60 bestdiast 73% lungs · axial · 0.59mm/px · z∈[+1723,+1819]mm · 5 of 74 slices shown]
[im 13/74  vessel]
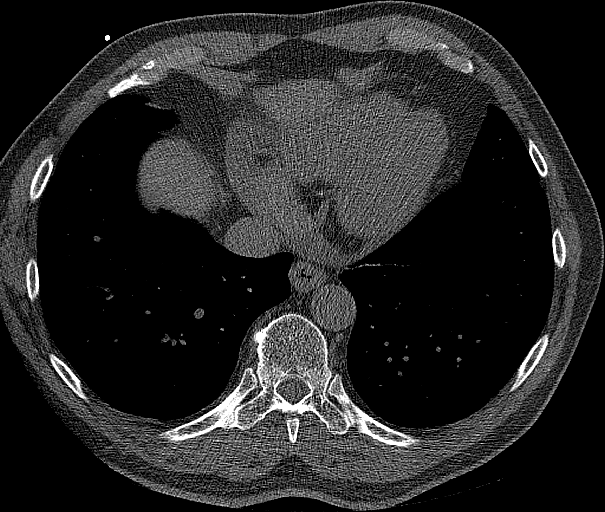
[im 25/74  vessel]
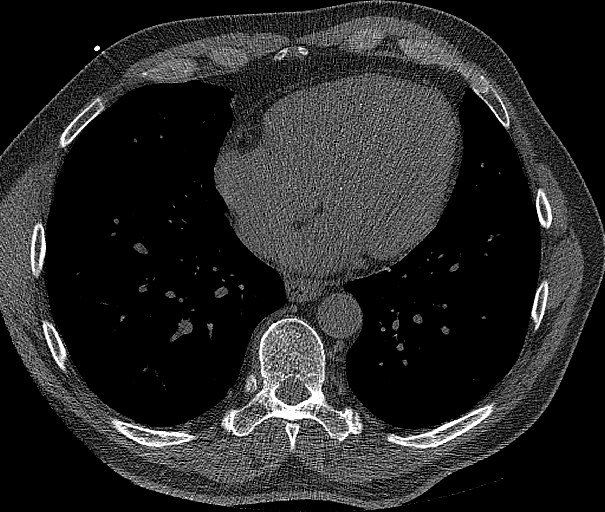
[im 37/74  vessel]
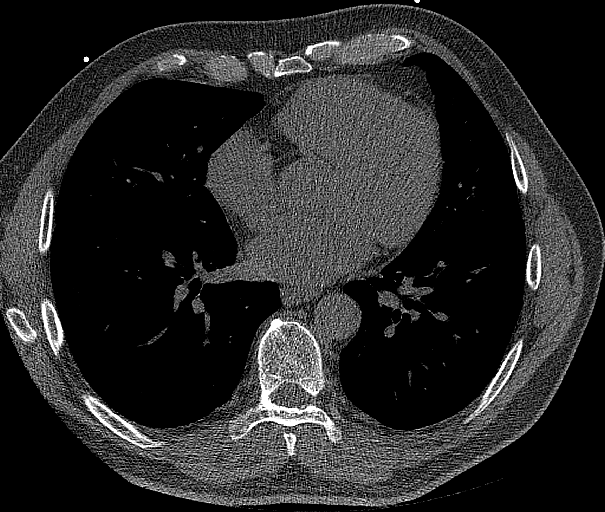
[im 49/74  vessel]
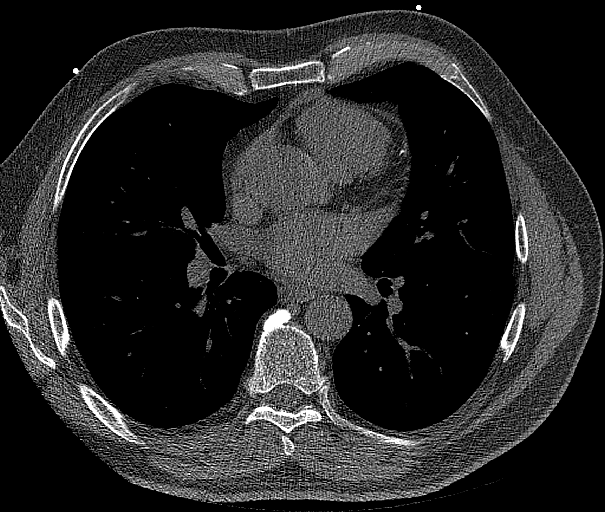
[im 61/74  vessel]
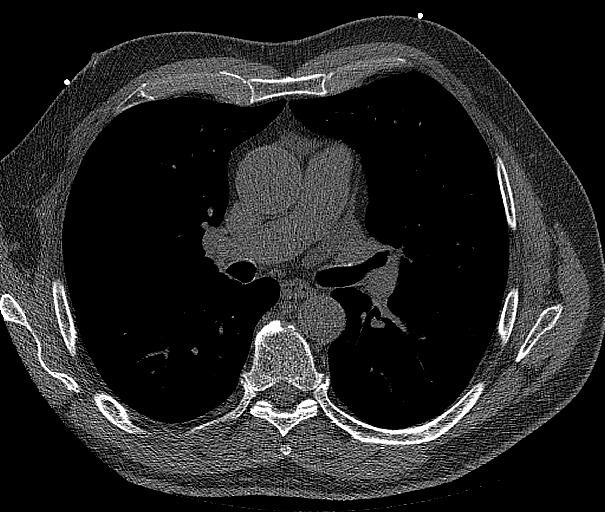

[14 of 20 positions shown; findings below may reference images not displayed]

FINDINGS: CORONARY CALCIUM SCORES:

Left Main: 0

LAD: 162

LCx: 0

RCA:

Total Agatston Score:

[HOSPITAL] percentile: 50

AORTA MEASUREMENTS:

Ascending Aorta: 38 mm

Descending Aorta: 27 mm

OTHER FINDINGS:

The heart size is within normal limits. No pericardial fluid is
identified. Visualized segments of the thoracic aorta and central
pulmonary arteries are normal in caliber. 2 x 5 mm subpleural
density in the right middle lobe that extends to the pleural surface
has an appearance most likely representative of focal scarring.
Visualized mediastinum and hilar regions demonstrate no
lymphadenopathy or masses. Visualized lungs show no evidence of
pulmonary edema, consolidation, pneumothorax or pleural fluid.
Visualized upper abdomen and bony structures are unremarkable.
IMPRESSION: 1. Coronary calcium score 163.3 is at the 50th percentile for the
patient's age, sex and race.
2. 2 x 5 mm subpleural nodular density in the right middle lobe
extends to the pleural surface and is most likely representative of
focal scarring. No follow-up needed if patient is low-risk.
Non-contrast chest CT can be considered in 12 months if patient is
high-risk. This recommendation follows the consensus statement:
Guidelines for Management of Incidental Pulmonary Nodules Detected

## 2022-09-11 ENCOUNTER — Other Ambulatory Visit (HOSPITAL_BASED_OUTPATIENT_CLINIC_OR_DEPARTMENT_OTHER): Payer: Self-pay

## 2022-09-11 MED ORDER — COMIRNATY 30 MCG/0.3ML IM SUSY
PREFILLED_SYRINGE | INTRAMUSCULAR | 0 refills | Status: AC
  Filled 2022-09-11: qty 0.3, 1d supply, fill #0

## 2023-02-01 LAB — LAB REPORT - SCANNED: EGFR: 69

## 2023-02-28 ENCOUNTER — Other Ambulatory Visit (HOSPITAL_BASED_OUTPATIENT_CLINIC_OR_DEPARTMENT_OTHER): Payer: Self-pay

## 2023-02-28 MED ORDER — COVID-19 MRNA VAC-TRIS(PFIZER) 30 MCG/0.3ML IM SUSY
0.3000 mL | PREFILLED_SYRINGE | Freq: Once | INTRAMUSCULAR | 0 refills | Status: AC
  Filled 2023-02-28: qty 0.3, 1d supply, fill #0

## 2023-02-28 MED ORDER — INFLUENZA VAC A&B SURF ANT ADJ 0.5 ML IM SUSY
0.5000 mL | PREFILLED_SYRINGE | Freq: Once | INTRAMUSCULAR | 0 refills | Status: AC
  Filled 2023-02-28: qty 0.5, 1d supply, fill #0

## 2023-03-13 ENCOUNTER — Other Ambulatory Visit: Payer: Self-pay | Admitting: Internal Medicine

## 2023-03-13 DIAGNOSIS — I7121 Aneurysm of the ascending aorta, without rupture: Secondary | ICD-10-CM

## 2023-03-17 ENCOUNTER — Encounter: Payer: Self-pay | Admitting: Internal Medicine

## 2023-03-18 ENCOUNTER — Encounter: Payer: Self-pay | Admitting: Internal Medicine

## 2023-03-28 ENCOUNTER — Other Ambulatory Visit: Payer: Medicare Other

## 2023-04-16 ENCOUNTER — Ambulatory Visit: Payer: Medicare Other | Attending: Cardiology | Admitting: Cardiology

## 2023-04-16 ENCOUNTER — Encounter: Payer: Self-pay | Admitting: Cardiology

## 2023-04-16 VITALS — BP 138/78 | HR 64 | Ht 71.0 in | Wt 222.6 lb

## 2023-04-16 DIAGNOSIS — E785 Hyperlipidemia, unspecified: Secondary | ICD-10-CM | POA: Diagnosis not present

## 2023-04-16 DIAGNOSIS — I7121 Aneurysm of the ascending aorta, without rupture: Secondary | ICD-10-CM

## 2023-04-16 DIAGNOSIS — R030 Elevated blood-pressure reading, without diagnosis of hypertension: Secondary | ICD-10-CM | POA: Diagnosis not present

## 2023-04-16 DIAGNOSIS — I77810 Thoracic aortic ectasia: Secondary | ICD-10-CM | POA: Diagnosis not present

## 2023-04-16 DIAGNOSIS — I2510 Atherosclerotic heart disease of native coronary artery without angina pectoris: Secondary | ICD-10-CM | POA: Insufficient documentation

## 2023-04-16 DIAGNOSIS — I10 Essential (primary) hypertension: Secondary | ICD-10-CM | POA: Insufficient documentation

## 2023-04-16 NOTE — Patient Instructions (Addendum)
Medication Instructions:   No changes *If you need a refill on your cardiac medications before your next appointment, please call your pharmacy*   Lab Work: BMP  If you have labs (blood work) drawn today and your tests are completely normal, you will receive your results only by: MyChart Message (if you have MyChart) OR A paper copy in the mail If you have any lab test that is abnormal or we need to change your treatment, we will call you to review the results.   Testing/Procedures:  Will be schedule at [facility] Non-Cardiac CT Angiography (CTA), is a special type of CT scan that uses a computer to produce multi-dimensional views of major blood vessels - aorta. In CT angiography, a contrast material is injected through an IV to help visualize the blood vessels    Follow-Up: At [facility], you and your health needs are our priority.  As part of our continuing mission to provide you with exceptional heart care, we have created designated Provider Care Teams.  These Care Teams include your primary Cardiologist (physician) and Advanced Practice Providers (APPs -  Physician Assistants and Nurse Practitioners) who all work together to provide you with the care you need, when you need it.     Your next appointment:   6 month(s)  The format for your next appointment:   In Person  Provider:   Bryan Lemma, MD

## 2023-04-16 NOTE — Progress Notes (Unsigned)
Cardiology Office Note:  .   Date:  04/17/2023  ID:  Rodney Mcdaniel, DOB [birth year], MRN [id number] PCP: Merri Brunette, MD  [facility] Providers Cardiologist:  Bryan Lemma, MD     No chief complaint on file.   Patient Profile: Rodney Mcdaniel Kitchen     Kevaughn Cutbirth is a relatively healthy/mildly obese 72 y.o. male  with a PMH notable for hyperlipidemia, hypothyroidism and anxiety as well as borderline hypertension and recently evaluated Coronary Calcium Score 169 who presents here for evaluation Management of Dilated Thoracic Aorta at the request of Merri Brunette, MD.    Krrish Roblyer was seen on 02/05/2023 by Dr. Renne Crigler for routine assessment.  Noted that his blood pressure little on the high side but he does not see the doctor but has not been managing it or monitoring it at home.  There is concern about the dilation of the ascending aorta noted on the CT scan-initially in the Coronary Calcium Score followed by the dedicated CT scan of the chest. Notably was tolerating his atorvastatin without myalgias.  No exercise intolerance, chest pain or dyspnea with rest or exertion.  They discussed the possibility of CTA Chest-Aorta to better evaluate the dilated thoracic aorta, but he was reluctant to do so because of his fear of IV needles.  They decided to refer to cardiology to discuss options.  Subjective  Discussed the use of AI scribe software for clinical note transcription with the patient, who gave verbal consent to proceed.  History of Present Illness   The patient, referred by Dr. Randa Spike, presents with a history of hypercholesterolemia and hypothyroidism, currently managed with medication. 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 The patient reports no symptoms related to this finding and maintains an active lifestyle, exercising approximately three times a week and walking on other  days.  The patient's blood pressure readings have been slightly elevated, with the highest recorded at 146/91. However, most readings are in the 130s or below for systolic and 70s to 80s for diastolic. The patient acknowledges a fondness for baked goods, which may impact dietary control of blood pressure.  The patient's coronary calcium score from September 2022 was 163, indicating the presence of atherosclerotic plaque. The patient's aorta was measured at 38mm on the coronary calcium score, but a subsequent CT scan measured it at 40mm. The patient has no family history of similar aortic issues.  The patient's cholesterol levels are well-managed, and blood sugars are within normal limits. The patient is a non-smoker. The patient has expressed anxiety about the aneurysm diagnosis and is hesitant about further invasive testing involving needles.      Home BP log shows blood pressure ranges from 128/75 up to 146/88 with the averages in the mid 130s over mid-upper 70s.  Cardiovascular ROS: no chest pain or dyspnea on exertion negative for - edema, irregular heartbeat, orthopnea, palpitations, paroxysmal nocturnal dyspnea, rapid heart rate, shortness of breath, or syncope/near-syncope or TIA/amaurosis fugax, claudication  ROS:  Review of Systems - Negative except symptoms noted above   Objective   Studies Reviewed: Rodney Mcdaniel Kitchen   EKG Interpretation Date/Time:  Wednesday April 16 2023 16:25:50 EDT Ventricular Rate:  64 PR Interval:  162 QRS Duration:  88 QT Interval:  402 QTC Calculation: 414 R Axis:   9  Text Interpretation: Normal sinus rhythm Normal ECG No previous ECGs available Confirmed by Bryan Lemma ([id number]) on  04/17/2023 10:22:52 PM    Coronary Calcium Score 02/16/2021: Agatston score of 163.  Ascending Aorta 38 mm, Descending Aorta 27 mm; 2 x 5 mm subpleural nodular density in the RML. Chest CT 03/27/2022: Stable 5 mm subpleural density in the RML.  Mild ascending thoracic aorta dilation 4  cm.  Recommend annual follow-up with CTA or MRA  Labs 01/31/2023: Cholesterol 131, TG 124, HDL 38, LDL 71; Cr 1.13, K+ 4.4.  Normal LFTs.;  Hgb 14.5.  TSH 3.91  Risk Assessment/Calculations:            Physical Exam:   VS:  BP 138/78   Pulse 64   Ht 5\' 11"  (1.803 m)   Wt 222 lb 9.6 oz (101 kg)   SpO2 97%   BMI 31.05 kg/m    Wt Readings from Last 3 Encounters:  04/16/23 222 lb 9.6 oz (101 kg)  01/25/21 214 lb 6 oz (97.2 kg)  11/01/20 215 lb (97.5 kg)    GEN: Well nourished, well developed in no acute distress; borderline obese but otherwise healthy-appearing. NECK: No JVD; No carotid bruits CARDIAC: Normal S1, S2; RRR, no murmurs, rubs, gallops RESPIRATORY:  Clear to auscultation without rales, wheezing or rhonchi ; nonlabored, good air movement. ABDOMEN: Soft, non-tender, non-distended EXTREMITIES:  No edema; No deformity     ASSESSMENT AND PLAN: .    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 No aortic murmur on examination. No symptoms of heart failure. Discussed the definition of aneurysm and the need for monitoring. -Order contrast CT of the aorta for a more definitive measurement. -If measurement is stable and less than 4.6 cm, consider rechecking in 2 years. If less than 4.0 cm, may not need further imaging.      Relevant Orders   EKG 12-Lead (Completed)   Basic metabolic panel   CT ANGIO CHEST AORTA W/CM & OR WO/CM   Hyperlipidemia LDL goal <70 (Chronic)    Cholesterol levels are well-controlled on current medication. -Continue current cholesterol medication.        Other   Elevated blood pressure reading in office without diagnosis of hypertension    Blood pressure readings in the office are slightly elevated, but not diagnostic of hypertension. Patient is active and has no symptoms of heart disease. -Recommend  lifestyle modifications including diet and exercise to lower blood pressure. -Recheck blood pressure in 6 months to assess response to lifestyle modifications and determine need for antihypertensive medication.      Relevant Orders   Basic metabolic panel   CT ANGIO CHEST AORTA W/CM & OR WO/CM         Follow-Up: Return in about 6 months (around 10/15/2023) for 6 month follow-up with me. -Follow-up in 6 months to check blood pressure and discuss results of aorta imaging.   Total time spent: 21 min spent with patient + 20 min spent charting = 41 min     Signed, Marykay Lex, MD, MS Bryan Lemma, M.D., M.S. Interventional Cardiologist  [facility]  Pager # [phone] Phone # [phone] [address] [place], [place] [address]

## 2023-04-17 ENCOUNTER — Encounter: Payer: Self-pay | Admitting: Cardiology

## 2023-04-17 NOTE — Assessment & Plan Note (Signed)
Cholesterol levels are well-controlled on current medication. -Continue current cholesterol medication.

## 2023-04-17 NOTE — Assessment & Plan Note (Signed)
Blood pressure readings in the office are slightly elevated, but not diagnostic of hypertension. Patient is active and has no symptoms of heart disease. -Recommend lifestyle modifications including diet and exercise to lower blood pressure. -Recheck blood pressure in 6 months to assess response to lifestyle modifications and determine need for antihypertensive medication.

## 2023-04-17 NOTE — Assessment & Plan Note (Signed)
Ascending thoracic aorta measuring 4.0 cm on previous imaging. No aortic murmur on examination. No symptoms of heart failure. Discussed the definition of aneurysm and the need for monitoring. -Order contrast CT of the aorta for a more definitive measurement. -If measurement is stable and less than 4.6 cm, consider rechecking in 2 years. If less than 4.0 cm, may not need further imaging.

## 2023-04-30 ENCOUNTER — Encounter: Payer: Self-pay | Admitting: Cardiology

## 2023-05-06 LAB — BASIC METABOLIC PANEL
BUN/Creatinine Ratio: 17 (ref 10–24)
BUN: 17 mg/dL (ref 8–27)
CO2: 19 mmol/L — ABNORMAL LOW (ref 20–29)
Calcium: 9 mg/dL (ref 8.6–10.2)
Chloride: 108 mmol/L — ABNORMAL HIGH (ref 96–106)
Creatinine, Ser: 1.02 mg/dL (ref 0.76–1.27)
Glucose: 115 mg/dL — ABNORMAL HIGH (ref 70–99)
Potassium: 4.3 mmol/L (ref 3.5–5.2)
Sodium: 142 mmol/L (ref 134–144)
eGFR: 77 mL/min/{1.73_m2} (ref >59)

## 2023-05-10 ENCOUNTER — Other Ambulatory Visit (HOSPITAL_BASED_OUTPATIENT_CLINIC_OR_DEPARTMENT_OTHER): Payer: Medicare Other

## 2023-05-19 ENCOUNTER — Ambulatory Visit (HOSPITAL_BASED_OUTPATIENT_CLINIC_OR_DEPARTMENT_OTHER)
Admission: RE | Admit: 2023-05-19 | Discharge: 2023-05-19 | Disposition: A | Discharge status: Home / Self Care | Payer: Medicare Other | Source: Ambulatory Visit | Attending: Cardiology | Admitting: Cardiology

## 2023-05-19 DIAGNOSIS — R030 Elevated blood-pressure reading, without diagnosis of hypertension: Secondary | ICD-10-CM | POA: Insufficient documentation

## 2023-05-19 DIAGNOSIS — I77810 Thoracic aortic ectasia: Secondary | ICD-10-CM | POA: Insufficient documentation

## 2023-05-19 MED ORDER — IOHEXOL 350 MG/ML SOLN
100.0000 mL | Freq: Once | INTRAVENOUS | Status: AC | PRN
  Administered 2023-05-19: 85 mL via INTRAVENOUS

## 2023-05-29 ENCOUNTER — Encounter: Payer: Self-pay | Admitting: Cardiology

## 2023-05-29 NOTE — Telephone Encounter (Signed)
The CT scan shows calcification on the coronary arteries. =- this suggests some cholesterol plaque. - calcium deposition on arteries is a marker of plaque -- but cannot determine amount or extent of plaque disease.    We can look more into this in the future with tests that are designed to focus on heart arteries & not the Aorta.  DH

## 2023-08-05 ENCOUNTER — Other Ambulatory Visit: Payer: Self-pay

## 2023-08-05 ENCOUNTER — Ambulatory Visit (INDEPENDENT_AMBULATORY_CARE_PROVIDER_SITE_OTHER): Payer: Medicare Other

## 2023-08-05 ENCOUNTER — Ambulatory Visit: Payer: Medicare Other | Admitting: Family Medicine

## 2023-08-05 ENCOUNTER — Encounter: Payer: Self-pay | Admitting: Family Medicine

## 2023-08-05 VITALS — BP 170/92 | HR 63 | Ht 71.0 in | Wt 224.0 lb

## 2023-08-05 DIAGNOSIS — M25511 Pain in right shoulder: Secondary | ICD-10-CM

## 2023-08-05 DIAGNOSIS — M79601 Pain in right arm: Secondary | ICD-10-CM

## 2023-08-05 DIAGNOSIS — G8929 Other chronic pain: Secondary | ICD-10-CM

## 2023-08-05 NOTE — Progress Notes (Signed)
   I, Stevenson Clinch, CMA acting as a scribe for Clementeen Graham, MD.  Rodney Mcdaniel is a 73 y.o. male who presents to [facility] at [facility] today for R arm pain x  month . Pt locates pain to right upper arm. Sx started after falling while ice skating. Pt is RHD. Sx worse with lateral raises, reaching overhead. Sharp shooting pain with ROM. Denies n/t/w, decreased grip strength  Radiates: upper arm Paresthesia: denies Grip strength: normal Aggravates: ROM Treatments tried: heat, Advil  Pertinent review of systems: No fevers or chills  Relevant historical information: Hyperlipidemia   Exam:  BP (!) 170/92   Pulse 63   Ht 5\' 11"  (1.803 m)   Wt 224 lb (101.6 kg)   SpO2 96%   BMI 31.24 kg/m  General: Well Developed, well nourished, and in no acute distress.   MSK: Right shoulder normal-appearing normal motion pain with abduction. Strength is intact some pain with resisted abduction. Positive Hawkins and Neer's test.  Positive empty can test. Negative Yergason's and speeds test. Pulses cap refill and sensation are intact distally.    Lab and Radiology Results  Diagnostic Limited MSK Ultrasound of: Right shoulder Biceps tendon intact some hypoechoic fluid surrounds tendon within tendon sheath. Subscapularis tendon is intact. Supraspinatus tendon is intact without visible retracted tear. Mild subacromial bursitis is present. Infraspinatus tendon is intact. AC joint effusion is present. Impression: Bursitis   X-ray images right shoulder obtained today personally and independently interpreted. No acute fractures.  Mild AC DJD.  Minimal glenohumeral DJD. Await formal radiology review   Assessment and Plan: 73 y.o. male with right shoulder pain after a fall.  Patient does not have a full-thickness retracted rotator cuff tear that I can see on ultrasound and he has well-preserved strength.  He is a good candidate for physical therapy.  Plan to refer to PT.   Recheck in 8 weeks.   PDMP not reviewed this encounter. Orders Placed This Encounter  Procedures   [place] LIMITED JOINT SPACE STRUCTURES UP RIGHT(NO LINKED CHARGES)    Reason for Exam (SYMPTOM  OR DIAGNOSIS REQUIRED):   right arm pain    Preferred imaging location?:   [facility] Shoulder Right    Standing Status:   Future    Number of Occurrences:   1    Expiration Date:   08/04/2024    Reason for Exam (SYMPTOM  OR DIAGNOSIS REQUIRED):   right shoulder pain    Preferred imaging location?:   [facility]   Ambulatory referral to Physical Therapy    Referral Priority:   Routine    Referral Type:   Physical Medicine    Referral Reason:   Specialty Services Required    Requested Specialty:   Physical Therapy    Number of Visits Requested:   1   No orders of the defined types were placed in this encounter.    Discussed warning signs or symptoms. Please see discharge instructions. Patient expresses understanding.   The above documentation has been reviewed and is accurate and complete Clementeen Graham, M.D.

## 2023-08-05 NOTE — Patient Instructions (Signed)
Thank you for coming in today.  Please get an Xray today before you leave  See you back in 8 weeks.

## 2023-08-26 ENCOUNTER — Encounter: Payer: Self-pay | Admitting: Family Medicine

## 2023-08-26 NOTE — Progress Notes (Signed)
Right shoulder x-ray shows mild arthritis

## 2023-08-29 ENCOUNTER — Other Ambulatory Visit: Payer: Self-pay

## 2023-08-29 ENCOUNTER — Encounter (HOSPITAL_BASED_OUTPATIENT_CLINIC_OR_DEPARTMENT_OTHER): Payer: Self-pay | Admitting: Physical Therapy

## 2023-08-29 ENCOUNTER — Ambulatory Visit (HOSPITAL_BASED_OUTPATIENT_CLINIC_OR_DEPARTMENT_OTHER): Payer: Medicare Other | Attending: Family Medicine | Admitting: Physical Therapy

## 2023-08-29 DIAGNOSIS — G8929 Other chronic pain: Secondary | ICD-10-CM | POA: Insufficient documentation

## 2023-08-29 DIAGNOSIS — M25511 Pain in right shoulder: Secondary | ICD-10-CM | POA: Insufficient documentation

## 2023-08-29 NOTE — Therapy (Signed)
 OUTPATIENT PHYSICAL THERAPY UPPER EXTREMITY EVALUATION   Patient Name: Rodney Mcdaniel MRN: [id number] DOB:[birth year], 73 y.o., male Today's Date: 08/29/2023  END OF SESSION:  PT End of Session - 08/29/23 0942     Visit Number 1    Number of Visits 8    Date for PT Re-Evaluation 10/24/23    PT Start Time 0930    PT Stop Time 1015    PT Time Calculation (min) 45 min    Activity Tolerance Patient tolerated treatment well    Behavior During Therapy [facility] for tasks assessed/performed             Past Medical History:  Diagnosis Date   Adenomatous colon polyp    Anxiety    BPH (benign prostatic hyperplasia)    Hypercholesteremia    Hypothyroidism    Internal hemorrhoid    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/15/2020   Past Surgical History:  Procedure Laterality Date   COLONOSCOPY     HEMORRHOID BANDING  2019   Via sigmoidoscopy   SIGMOIDOSCOPY     TONSILLECTOMY     as a child   VASECTOMY     Patient Active Problem List   Diagnosis Date Noted   Hyperlipidemia LDL goal <70 04/16/2023   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   Elevated blood pressure reading in office without diagnosis of hypertension 04/16/2023   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/15/2020   Prolapsed internal hemorrhoids, grade 2 04/04/2020   Fecal smearing 04/04/2020    PCP: Merri Brunette MD  REFERRING PROVIDER: Clementeen Graham MD   REFERRING DIAG:  Diagnosis  M79.601 (ICD-10-CM) - Right arm pain    THERAPY DIAG:  Acute pain of right shoulder - Plan: PT plan of care cert/re-cert  Rationale for Evaluation and Treatment: Rehabilitation  ONSET DATE: 2 months prior   SUBJECTIVE:                                                                                                                                                                                      SUBJECTIVE STATEMENT: Approximately 2 months ago the patient was ice-skating and fell onto his right shoulder.   At that point he had a progressive onset of significant pain in his shoulder.  Since that time the pain is improved.  He is now just having positional pain.  His most of his pain when he is reaching overhead.  The MD did ultrasound that showed no rotator cuff tear.  He was diagnosed with Buriev bursitis.  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  Patient comes to the gym 2-3 times a week. Hand dominance: Right  PERTINENT  HISTORY: Anxiety   PAIN:  Are you having pain? Yes: NPRS scale: 6-7/10 Pain location: right anterior shoulder  Pain description: aching   Aggravating factors: Reaching overhead. Relieving factors: Not putting in to certain positions.  PRECAUTIONS: None  RED FLAGS: None   WEIGHT BEARING RESTRICTIONS: No  FALLS:  Has patient fallen in last 6 months? Yes ice skating   LIVING ENVIRONMENT: Lives with:  OCCUPATION: Retired    PLOF: Independent  PATIENT GOALS:  To have less pain in the shoulder   NEXT MD VISIT:    OBJECTIVE:  Note: Objective measures were completed at Evaluation unless otherwise noted.  DIAGNOSTIC FINDINGS:  X-ray: Minor OA Diagnostic Limited MSK Ultrasound of: Right shoulder Biceps tendon intact some hypoechoic fluid surrounds tendon within tendon sheath. Subscapularis tendon is intact. Supraspinatus tendon is intact without visible retracted tear. Mild subacromial bursitis is present. Infraspinatus tendon is intact. AC joint effusion is present. Impression: Bursitis   PATIENT SURVEYS :  Quick Dash 22/55  COGNITION: Overall cognitive status: Within functional limits for tasks assessed     SENSATION: WFL  POSTURE: Good posture   UPPER EXTREMITY ROM:   Active ROM Right eval Left eval  Shoulder flexion Pain past 90 degrees WNL  Shoulder extension    Shoulder abduction    Shoulder adduction    Shoulder internal rotation Pain reaching to WNL  Shoulder external rotation Pain reaching behind his head WNL  Elbow  flexion    Elbow extension    Wrist flexion    Wrist extension    Wrist ulnar deviation    Wrist radial deviation    Wrist pronation    Wrist supination    (Blank rows = not tested)  UPPER EXTREMITY MMT:  MMT Right eval Left eval  Shoulder flexion 20.9 mild pin  21  Shoulder extension    Shoulder abduction    Shoulder adduction    Shoulder internal rotation 20.1 20.5  Shoulder external rotation 15.5 22.2  Middle trapezius    Lower trapezius    Elbow flexion    Elbow extension    Wrist flexion    Wrist extension    Wrist ulnar deviation    Wrist radial deviation    Wrist pronation    Wrist supination    Grip strength (lbs)    (Blank rows = not tested)    PALPATION:  Mild tightness of the upper trap but equal                                                                                                                              TREATMENT DATE:  Manual: Skilled palpation of trigger points.  Review of self trigger point release to the upper trap.  Grade 3 and 4 posterior and inferior mobilizations of the shoulder.   Exercises - Seated Upper Trapezius Stretch  - 1 x daily - 7 x weekly - 1 sets - 3 reps - 15-20 hold -  Doorway Pec Stretch at 90 Degrees Abduction  - 1 x daily - 7 x weekly - 3 sets - 3 reps - 15-20 hold - Sidelying Shoulder External Rotation  - 1 x daily - 7 x weekly - 3 sets - 10 reps - Shoulder extension with resistance - Neutral  - 1 x daily - 7 x weekly - 3 sets - 10 reps - Scapular Retraction with Resistance  - 1 x daily - 7 x weekly - 3 sets - 10 reps - Standing Cable Chops  - 1 x daily - 7 x weekly - 3 sets - 10 reps    PATIENT EDUCATION: Education details: HEP, symptom management  Person educated: Patient Education method: Explanation, Demonstration, Tactile cues, Verbal cues, and Handouts Education comprehension: verbalized understanding, returned demonstration, verbal cues required, tactile cues required, and needs further  education  HOME EXERCISE PROGRAM: Access Code: [email or web address] URL: [email or web address] Date: 08/29/2023 Prepared by: Herbie Baltimore  ASSESSMENT:  CLINICAL IMPRESSION: Patient is a 73 year old male with acute onset of right shoulder pain approximately 2 months ago following a fall on the ice rink.  Since that point.  His pain is decreased.  He is only having pain with certain positions.  He has increased pain with overhead reaching and increased pain reaching behind his head.  He has no significant tenderness to palpation on his biceps or with compression of his AC joint at this time.  He does have strength limitation with external rotation of about 25%.  Therapy performed trial of manual therapy with posterior glides and inferior glides.  Following he had improved pain-free active shoulder flexion.  We reviewed the gym program that he is currently on.  We reviewed exercises to avoid for right now.  We also reviewed using RPE to grade his exercises.  He tolerated well.  He will work on his exercises for 2 weeks then we will follow-up for adjust exercises as needed.  OBJECTIVE IMPAIRMENTS: decreased ROM, decreased strength, impaired UE functional use, and pain.   ACTIVITY LIMITATIONS: carrying, lifting, and reach over head  PARTICIPATION LIMITATIONS:  going to the gym; community activity   PERSONAL FACTORS: 1 comorbidity: anxiety   are also affecting patient's functional outcome.   REHAB POTENTIAL: Excellent  CLINICAL DECISION MAKING: Stable/uncomplicated  EVALUATION COMPLEXITY: Low  GOALS: Goals reviewed with patient? Yes  SHORT TERM GOALS: Target date: 10/24/2023    Patient will increase right ER by 5 lbs  Baseline: Goal status: INITIAL  2.  Patient will demonstrate full right shoulder flexion without pain Baseline:  Goal status: INITIAL  3.  Patient will be independent with basic HEP Baseline:  Goal status: INITIAL  LONG TERM GOALS: Target date:  10/24/2023    Return to full gym program without any pain Baseline:  Goal status: INITIAL  2.  Patient will reach behind his head without any pain Baseline:  Goal status: INITIAL  3.  Patient will reach overhead to shelf with 2 pound weight without pain in order to perform ADLs Baseline:  Goal status: INITIAL  PLAN: PT FREQUENCY: 1x/week  PT DURATION: 8 weeks  PLANNED INTERVENTIONS: 97110-Therapeutic exercises, 97530- Therapeutic activity, [id number]- Neuromuscular re-education, 97535- Self Care, [id number]- Manual therapy, [id number]- Gait training, [id number]- Electrical stimulation (unattended), 97035- Ultrasound, Patient/Family education, Taping, Dry Needling, DME instructions, Cryotherapy, and Moist heat   PLAN FOR NEXT SESSION:   Continue with anterior posterior glides.  Review gym program.  Progression program as tolerated.  Consider standing forward flexion and scaption in the  mirror.  Review free weights.  Consider review of bicep curls triceps and any exercise he wants to do in the gym.   Dessie Coma, PT 08/29/2023, 12:20 PM

## 2023-09-15 ENCOUNTER — Encounter (HOSPITAL_BASED_OUTPATIENT_CLINIC_OR_DEPARTMENT_OTHER): Payer: Self-pay | Admitting: Physical Therapy

## 2023-09-15 ENCOUNTER — Ambulatory Visit (HOSPITAL_BASED_OUTPATIENT_CLINIC_OR_DEPARTMENT_OTHER): Admitting: Physical Therapy

## 2023-09-15 DIAGNOSIS — M25511 Pain in right shoulder: Secondary | ICD-10-CM

## 2023-09-15 NOTE — Therapy (Signed)
 OUTPATIENT PHYSICAL THERAPY UPPER EXTREMITY EVALUATION   Patient Name: Rodney Mcdaniel MRN: [id number] DOB:05/24/1951, 73 y.o., male Today's Date: 09/16/2023  END OF SESSION:  PT End of Session - 09/15/23 1142     Visit Number 2    Number of Visits 8    Date for PT Re-Evaluation 10/24/23    PT Start Time 1145    PT Stop Time 1228    PT Time Calculation (min) 43 min    Activity Tolerance Patient tolerated treatment well;No increased pain    Behavior During Therapy [facility] for tasks assessed/performed              Past Medical History:  Diagnosis Date   Adenomatous colon polyp    Anxiety    BPH (benign prostatic hyperplasia)    Hypercholesteremia    Hypothyroidism    Internal hemorrhoid    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/15/2020   Past Surgical History:  Procedure Laterality Date   COLONOSCOPY     HEMORRHOID BANDING  2019   Via sigmoidoscopy   SIGMOIDOSCOPY     TONSILLECTOMY     as a child   VASECTOMY     Patient Active Problem List   Diagnosis Date Noted   Hyperlipidemia LDL goal <70 04/16/2023   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   Elevated blood pressure reading in office without diagnosis of hypertension 04/16/2023   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/15/2020   Prolapsed internal hemorrhoids, grade 2 04/04/2020   Fecal smearing 04/04/2020    PCP: Merri Brunette MD  REFERRING PROVIDER: Clementeen Graham MD   REFERRING DIAG:  Diagnosis  M79.601 (ICD-10-CM) - Right arm pain    THERAPY DIAG:  Acute pain of right shoulder  Rationale for Evaluation and Treatment: Rehabilitation  ONSET DATE: 2 months prior   SUBJECTIVE:                                                                                                                                                                                      SUBJECTIVE STATEMENT: 3/31 Pt reported stiffness this morning that went away once he got up and moving. Has pain in shoulder  flexion which he describes as sharp and nervy.    Approximately 2 months ago the patient was ice-skating and fell onto his right shoulder.  At that point he had a progressive onset of significant pain in his shoulder.  Since that time the pain is improved.  He is now just having positional pain.  His most of his pain when he is reaching overhead.  The MD did ultrasound that showed no rotator cuff tear.  He was diagnosed with  Buriev bursitis.  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  Patient comes to the gym 2-3 times a week. Hand dominance: Right  PERTINENT HISTORY: Anxiety   PAIN:  Are you having pain? Yes: NPRS scale: 6-7/10 Pain location: right anterior shoulder  Pain description: aching   Aggravating factors: Reaching overhead. Relieving factors: Not putting in to certain positions.  PRECAUTIONS: None  RED FLAGS: None   WEIGHT BEARING RESTRICTIONS: No  FALLS:  Has patient fallen in last 6 months? Yes ice skating   LIVING ENVIRONMENT: Lives with:  OCCUPATION: Retired    PLOF: Independent  PATIENT GOALS:  To have less pain in the shoulder   NEXT MD VISIT:    OBJECTIVE:  Note: Objective measures were completed at Evaluation unless otherwise noted.  DIAGNOSTIC FINDINGS:  X-ray: Minor OA Diagnostic Limited MSK Ultrasound of: Right shoulder Biceps tendon intact some hypoechoic fluid surrounds tendon within tendon sheath. Subscapularis tendon is intact. Supraspinatus tendon is intact without visible retracted tear. Mild subacromial bursitis is present. Infraspinatus tendon is intact. AC joint effusion is present. Impression: Bursitis   PATIENT SURVEYS :  Quick Dash 22/55  COGNITION: Overall cognitive status: Within functional limits for tasks assessed     SENSATION: WFL  POSTURE: Good posture   UPPER EXTREMITY ROM:   Active ROM Right eval Left eval  Shoulder flexion Pain past 90 degrees WNL  Shoulder extension    Shoulder abduction     Shoulder adduction    Shoulder internal rotation Pain reaching to WNL  Shoulder external rotation Pain reaching behind his head WNL  Elbow flexion    Elbow extension    Wrist flexion    Wrist extension    Wrist ulnar deviation    Wrist radial deviation    Wrist pronation    Wrist supination    (Blank rows = not tested)  UPPER EXTREMITY MMT:  MMT Right eval Left eval  Shoulder flexion 20.9 mild pin  21  Shoulder extension    Shoulder abduction    Shoulder adduction    Shoulder internal rotation 20.1 20.5  Shoulder external rotation 15.5 22.2  Middle trapezius    Lower trapezius    Elbow flexion    Elbow extension    Wrist flexion    Wrist extension    Wrist ulnar deviation    Wrist radial deviation    Wrist pronation    Wrist supination    Grip strength (lbs)    (Blank rows = not tested)    PALPATION:  Mild tightness of the upper trap but equal                                                                                                                              TREATMENT DATE:  3/31 Manual: All PROM performed with distraction to reduce pain and improve movement Skilled palpation of trigger points.  Review of self trigger point release to the upper  trap.  Grade 3 and 4 posterior and inferior mobilizations of the shoulder. PROM There-ex: Tricep ext with rope 3x10 RPE 5 Standing cable chops 3x10 20lbs RPE 5 Standing cable rows straight bar 3x10 Standing shoulder FLEX 3x10 with towel on wall Standing shoulder ABD 3x10 with towel on wall AAROM shoulder ABD with bar/golf club 3x10   Eval:  Exercises - Seated Upper Trapezius Stretch  - 1 x daily - 7 x weekly - 1 sets - 3 reps - 15-20 hold - Doorway Pec Stretch at 90 Degrees Abduction  - 1 x daily - 7 x weekly - 3 sets - 3 reps - 15-20 hold - Sidelying Shoulder External Rotation  - 1 x daily - 7 x weekly - 3 sets - 10 reps - Shoulder extension with resistance - Neutral  - 1 x daily - 7 x  weekly - 3 sets - 10 reps - Scapular Retraction with Resistance  - 1 x daily - 7 x weekly - 3 sets - 10 reps - Standing Cable Chops  - 1 x daily - 7 x weekly - 3 sets - 10 reps    PATIENT EDUCATION: Education details: HEP, symptom management  Person educated: Patient Education method: Explanation, Demonstration, Tactile cues, Verbal cues, and Handouts Education comprehension: verbalized understanding, returned demonstration, verbal cues required, tactile cues required, and needs further education  HOME EXERCISE PROGRAM: Access Code: [email or web address] URL: [email or web address] Date: 08/29/2023 Prepared by: Herbie Baltimore  ASSESSMENT:  CLINICAL IMPRESSION: 3/31 Pt warmed up on the arm bike for 5 min with no increase in symptoms. While warming up we collected subjective and moved on to PROM with AP and inferior glides grade 3&4. Did test, retest with PROM flex and abd showing visible improvement after mobilizations. Moved on to therapeutic exercises focusing on shoulder and arm strengthening with cueing for posture. Catered exercises to desire to get back to golfing. Patient tolerated all exercises well. Pt had a little pain doing the cable chops but reported after the second set that the pain went away. Pt was also educated on sore but safe exercises encouraging healthy movement during this time. Pt will continue to benefit from skilled physical therapy to increase shoulder ROM, decrease pain, and return back to golf.    Patient is a 73 year old male with acute onset of right shoulder pain approximately 2 months ago following a fall on the ice rink.  Since that point.  His pain is decreased.  He is only having pain with certain positions.  He has increased pain with overhead reaching and increased pain reaching behind his head.  He has no significant tenderness to palpation on his biceps or with compression of his AC joint at this time.  He does have strength limitation with external  rotation of about 25%.  Therapy performed trial of manual therapy with posterior glides and inferior glides.  Following he had improved pain-free active shoulder flexion.  We reviewed the gym program that he is currently on.  We reviewed exercises to avoid for right now.  We also reviewed using RPE to grade his exercises.  He tolerated well.  He will work on his exercises for 2 weeks then we will follow-up for adjust exercises as needed.  OBJECTIVE IMPAIRMENTS: decreased ROM, decreased strength, impaired UE functional use, and pain.   ACTIVITY LIMITATIONS: carrying, lifting, and reach over head  PARTICIPATION LIMITATIONS:  going to the gym; community activity   PERSONAL FACTORS: 1 comorbidity: anxiety   are also affecting patient's  functional outcome.   REHAB POTENTIAL: Excellent  CLINICAL DECISION MAKING: Stable/uncomplicated  EVALUATION COMPLEXITY: Low  GOALS: Goals reviewed with patient? Yes  SHORT TERM GOALS: Target date: 10/24/2023    Patient will increase right ER by 5 lbs  Baseline: Goal status: INITIAL  2.  Patient will demonstrate full right shoulder flexion without pain Baseline:  Goal status: INITIAL  3.  Patient will be independent with basic HEP Baseline:  Goal status: INITIAL  LONG TERM GOALS: Target date: 10/24/2023    Return to full gym program without any pain Baseline:  Goal status: INITIAL  2.  Patient will reach behind his head without any pain Baseline:  Goal status: INITIAL  3.  Patient will reach overhead to shelf with 2 pound weight without pain in order to perform ADLs Baseline:  Goal status: INITIAL  PLAN: PT FREQUENCY: 1x/week  PT DURATION: 8 weeks  PLANNED INTERVENTIONS: 97110-Therapeutic exercises, 97530- Therapeutic activity, [id number]- Neuromuscular re-education, 97535- Self Care, [id number]- Manual therapy, [id number]- Gait training, [id number]- Electrical stimulation (unattended), 97035- Ultrasound, Patient/Family education, Taping, Dry  Needling, DME instructions, Cryotherapy, and Moist heat   PLAN FOR NEXT SESSION:   Continue with anterior posterior and inferior glides.  Review gym program.  Progress program as tolerated.  Consider standing forward flexion and scaption in the mirror.  Review free weights.  Consider review of bicep curls triceps and any exercise he wants to do in the gym.   Dessie Coma, PT 09/16/2023, 10:16 AM

## 2023-09-18 ENCOUNTER — Encounter (HOSPITAL_BASED_OUTPATIENT_CLINIC_OR_DEPARTMENT_OTHER): Admitting: Physical Therapy

## 2023-09-24 ENCOUNTER — Encounter: Payer: Self-pay | Admitting: Family Medicine

## 2023-09-24 ENCOUNTER — Ambulatory Visit: Admitting: Family Medicine

## 2023-09-24 VITALS — BP 158/88 | HR 55 | Ht 71.0 in | Wt 223.0 lb

## 2023-09-24 DIAGNOSIS — M25511 Pain in right shoulder: Secondary | ICD-10-CM

## 2023-09-24 DIAGNOSIS — G8929 Other chronic pain: Secondary | ICD-10-CM | POA: Diagnosis not present

## 2023-09-24 NOTE — Patient Instructions (Signed)
 Thank you for coming in today.

## 2023-09-24 NOTE — Progress Notes (Unsigned)
   I, Stevenson Clinch, CMA acting as a scribe for Clementeen Graham, MD.  Rodney Mcdaniel is a 73 y.o. male who presents to [facility] at [facility] today for f/u R shoulder pain. Pt was last seen by Dr. Denyse Amass on 08/05/23 and was referred to PT, completing 2 visits.  Today, pt reports significant improvement of shoulder sx with PT. Compliant with HEP. IBU daily. Will still have occasional twinge of pain with certain movements but sx are better overall. Would like to discuss safe use of IBU.   Dx imaging: 08/05/23 R shoulder XR  Pertinent review of systems: No fevers or chills  Relevant historical information: Aortic dilation and hyperlipidemia.   Exam:  BP (!) 158/88   Pulse (!) 55   Ht 5\' 11"  (1.803 m)   Wt 223 lb (101.2 kg)   SpO2 95%   BMI 31.10 kg/m  General: Well Developed, well nourished, and in no acute distress.   MSK: Right shoulder normal-appearing normal motion intact strength.  Positive Hawkins and Neer's test.  Positive empty can test.    Lab and Radiology Results  EXAM: RIGHT SHOULDER - 2+ VIEW   COMPARISON:  None Available.   FINDINGS: There is no evidence of fracture or dislocation. Mild acromioclavicular degenerative spurring. There is trace inferior glenoid spurring. No erosive change or focal bone abnormality. Soft tissues are unremarkable.   IMPRESSION: Mild acromioclavicular and mild glenohumeral degenerative change.     Electronically Signed   By: Narda Rutherford M.D.   On: 08/24/2023 12:32 I, Clementeen Graham, personally (independently) visualized and performed the interpretation of the images attached in this note.    Assessment and Plan: 73 y.o. 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  Improving but not fully better.  He has had some physical therapy sessions and has another one scheduled in about a month.  Plan to continue home exercise program and check back as needed.  Next steps would be steroid  injection or MRI.  He will keep me updated with how he feels.   PDMP not reviewed this encounter. No orders of the defined types were placed in this encounter.  No orders of the defined types were placed in this encounter.    Discussed warning signs or symptoms. Please see discharge instructions. Patient expresses understanding.   The above documentation has been reviewed and is accurate and complete Clementeen Graham, M.D.

## 2023-09-25 ENCOUNTER — Ambulatory Visit: Payer: Medicare Other | Admitting: Family Medicine

## 2023-10-19 NOTE — Progress Notes (Signed)
 Cardiology Office Note:  .   Date:  10/27/2023  ID:  Rodney Mcdaniel, DOB 1950/10/28, MRN [id number] PCP: Imelda Man, MD  [facility] Providers Cardiologist:  Randene Bustard, MD     Chief Complaint  Patient presents with   Follow-up    ~ dilated aorta   Hypertension    Poorly controlled    Patient Profile: Rodney Mcdaniel     [name] is a mildly obese but otherwise relatively healthy 73 y.o. male with a PMH notable for hyperlipidemia, borderline HTN with CAC of 169 and hypothyroidism as well as anxiety who presents here for 19-month follow-up at the request of Imelda Man, MD.     Rodney Mcdaniel was last seen on April 16, 2023 for evaluation of dilated thoracic aorta on CT scan.  Had borderline blood pressure but otherwise no major complaints.  Dedicated CTA Chest-Aorta Ordered.  He now presents for follow-up.  Subjective  Discussed the use of AI scribe software for clinical note transcription with the patient, who gave verbal consent to proceed.  History of Present Illness History of Present Illness Rodney Mcdaniel is a 73 year old male with aortic dilation who presents for follow-up on his aortic condition and blood pressure management.  He has a history of aortic dilation, previously measured at 4.0 cm, now 3.9 cm on a recent CT scan. No symptoms such as chest pain, shortness of breath, or palpitations are reported.  His blood pressure readings at [facility] are typically around 130-140 mmHg, but he notes higher readings in clinical settings, such as 150 mmHg at a sports medicine appointment and 170 mmHg during today's visit. He attributes some of this elevation to 'white coat syndrome.' No symptoms such as headaches, blurred vision, dizziness, or leg swelling are reported.  He is currently taking atorvastatin 20 mg daily for cholesterol management, with recent labs showing a total cholesterol of 131 mg/dL, LDL of 71 mg/dL, HDL of 38 mg/dL, and triglycerides of 875 mg/dL. He also  takes levothyroxine 112 mcg daily for thyroid management and uses Xanax as needed.  He reports a recent fall while ice skating, resulting in a shoulder injury. He is still recovering from this incident, which occurred while teaching his granddaughter to ice skate.  He has a history of basal cell carcinoma on his nose, which has been treated successfully. He mentions a suspicion of developing cataracts but no sudden vision changes.  Cardiovascular ROS: no chest pain or dyspnea on exertion positive for - elevated blood pressures when he comes to MD visits negative for - edema, irregular heartbeat, orthopnea, palpitations, paroxysmal nocturnal dyspnea, rapid heart rate, shortness of breath, or syncope or near-syncope, TIA or amaurosis fugax.  ROS:  Review of Systems - Negative except symptoms noted in HPI-shoulder injury    Objective    Studies Reviewed: Rodney Mcdaniel        No results found for: "CHOL", "HDL", "LDLCALC", "LDLDIRECT", "TRIG", "CHOLHDL" Total cholesterol: 131 (01/31/2023) Triglycerides: 124 (01/31/2023) HDL: 38 (01/31/2023) LDL: 71 (01/31/2023) Creatinine: 1.02 (04/2023) Potassium: 4.3 (04/2023)  RADIOLOGY CT Angio Chest-Aorta 05/19/23: 3.9 cm ascending thoracic aorta-coronary calcification  Risk Assessment/Calculations:     HYPERTENSION CONTROL Vitals:   10/20/23 1013 10/20/23 1110 10/20/23 1120  BP: (!) 174/84 (!) 164/88 (!) 156/84    The patient's blood pressure is elevated above target today.  In order to address the patient's elevated BP: Blood pressure will be monitored at [facility] to determine if medication changes need to be made.; A new medication was  prescribed today. (Will add combination ARB-HCTZ.  Check labs and reassess.)          Physical Exam:   VS:  BP (!) 156/84   Pulse 77   Ht 5\' 11"  (1.803 m)   Wt 218 lb (98.9 kg)   SpO2 94%   BMI 30.40 kg/m    Wt Readings from Last 3 Encounters:  10/20/23 218 lb (98.9 kg)  09/24/23 223 lb (101.2 kg)  08/05/23  224 lb (101.6 kg)    GEN: Well nourished, well developed in no acute distress; mildly obese but otherwise healthy-appearing. NECK: No JVD; No carotid bruits CARDIAC: Normal S1, S2; RRR, no murmurs, rubs, gallops RESPIRATORY:  Clear to auscultation without rales, wheezing or rhonchi ; nonlabored, good air movement. ABDOMEN: Soft, non-tender, non-distended EXTREMITIES:  No edema; No deformity      ASSESSMENT AND PLAN: .    Problem List Items Addressed This Visit       Cardiology Problems   Dilation of thoracic aorta (HCC) (Chronic)   Aortic dilation at 3.9 cm, likely normal for him. No intervention needed. - Recheck with CT scan in two years if stable.      Relevant Medications   valsartan -hydrochlorothiazide  (DIOVAN -HCT) 80-12.5 MG tablet   Other Relevant Orders   Basic metabolic panel with GFR (Completed)   Basic metabolic panel with GFR   Hyperlipidemia LDL goal <70 (Chronic)   Most recent LDL was 71 in August 2024. -Continue Lipitor 20 mg daily.      Relevant Medications   valsartan -hydrochlorothiazide  (DIOVAN -HCT) 80-12.5 MG tablet   Primary hypertension - Primary (Chronic)   Blood pressure elevated, likely white coat syndrome. [facility] readings 130s-140s. Discussed starting low-dose antihypertensive to manage hypertension and reduce aortic dilation and coronary disease risk. Explained side effects and monitoring needs. - Recheck blood pressure before leaving. - Start valsartan  HCTZ 80/12.5 mg if elevated. - Order basic metabolic panel post-medication start.      Relevant Medications   valsartan -hydrochlorothiazide  (DIOVAN -HCT) 80-12.5 MG tablet   Other Relevant Orders   Basic metabolic panel with GFR (Completed)   Basic metabolic panel with GFR         Follow-Up: Return in about 1 year (around 10/19/2024).    Signed, Arleen Lacer, MD, MS Randene Bustard, M.D., M.S. Interventional Chartered certified accountant  Pager # [phone]

## 2023-10-20 ENCOUNTER — Encounter: Payer: Self-pay | Admitting: Cardiology

## 2023-10-20 ENCOUNTER — Ambulatory Visit: Payer: Medicare Other | Attending: Cardiology | Admitting: Cardiology

## 2023-10-20 VITALS — BP 156/84 | HR 77 | Ht 71.0 in | Wt 218.0 lb

## 2023-10-20 DIAGNOSIS — I77810 Thoracic aortic ectasia: Secondary | ICD-10-CM

## 2023-10-20 DIAGNOSIS — R030 Elevated blood-pressure reading, without diagnosis of hypertension: Secondary | ICD-10-CM

## 2023-10-20 DIAGNOSIS — E785 Hyperlipidemia, unspecified: Secondary | ICD-10-CM | POA: Diagnosis not present

## 2023-10-20 DIAGNOSIS — I10 Essential (primary) hypertension: Secondary | ICD-10-CM | POA: Diagnosis not present

## 2023-10-20 MED ORDER — VALSARTAN-HYDROCHLOROTHIAZIDE 80-12.5 MG PO TABS: 1.0000 | ORAL_TABLET | Freq: Every day | ORAL | 3 refills | Status: DC

## 2023-10-20 NOTE — Patient Instructions (Signed)
 Medication Instructions:   Start  Valsartan 80/12.5 mg  daily   *If you need a refill on your cardiac medications before your next appointment, please call your pharmacy*   Lab Work: BMP - today  BMP on 11/10/23  If you have labs (blood work) drawn today and your tests are completely normal, you will receive your results only by: MyChart Message (if you have MyChart) OR A paper copy in the mail If you have any lab test that is abnormal or we need to change your treatment, we will call you to review the results.   Testing/Procedures: Not needed   Follow-Up: At [facility], you and your health needs are our priority.  As part of our continuing mission to provide you with exceptional heart care, we have created designated Provider Care Teams.  These Care Teams include your primary Cardiologist (physician) and Advanced Practice Providers (APPs -  Physician Assistants and Nurse Practitioners) who all work together to provide you with the care you need, when you need it.     Your next appointment:   12 month(s)  The format for your next appointment:   In Person  Provider:   Randene Bustard, MD or Lawana Pray, NP, Palmer Bobo, NP, Ervin Heath, PA-C, or Marlana Silvan, NP

## 2023-10-21 LAB — BASIC METABOLIC PANEL WITH GFR
BUN/Creatinine Ratio: 17 (ref 10–24)
BUN: 19 mg/dL (ref 8–27)
CO2: 23 mmol/L (ref 20–29)
Calcium: 9.9 mg/dL (ref 8.6–10.2)
Chloride: 105 mmol/L (ref 96–106)
Creatinine, Ser: 1.09 mg/dL (ref 0.76–1.27)
Glucose: 98 mg/dL (ref 70–99)
Potassium: 5 mmol/L (ref 3.5–5.2)
Sodium: 141 mmol/L (ref 134–144)
eGFR: 71 mL/min/{1.73_m2} (ref >59)

## 2023-10-23 ENCOUNTER — Encounter: Payer: Self-pay | Admitting: Cardiology

## 2023-10-27 ENCOUNTER — Encounter (HOSPITAL_BASED_OUTPATIENT_CLINIC_OR_DEPARTMENT_OTHER): Payer: Self-pay | Admitting: Physical Therapy

## 2023-10-27 ENCOUNTER — Ambulatory Visit (HOSPITAL_BASED_OUTPATIENT_CLINIC_OR_DEPARTMENT_OTHER): Attending: Family Medicine | Admitting: Physical Therapy

## 2023-10-27 ENCOUNTER — Encounter: Payer: Self-pay | Admitting: Cardiology

## 2023-10-27 DIAGNOSIS — M25511 Pain in right shoulder: Secondary | ICD-10-CM | POA: Insufficient documentation

## 2023-10-27 NOTE — Therapy (Unsigned)
 OUTPATIENT PHYSICAL THERAPY UPPER EXTREMITY EVALUATION   Patient Name: Rodney Mcdaniel MRN: [id number] DOB:10-22-1950, 73 y.o., male Today's Date: 10/28/2023  END OF SESSION:  PT End of Session - 10/27/23 1011     Visit Number 3    Number of Visits 8    Date for PT Re-Evaluation 12/22/23    PT Start Time 1009    PT Stop Time 1102    PT Time Calculation (min) 53 min    Activity Tolerance Patient tolerated treatment well;No increased pain    Behavior During Therapy [facility] for tasks assessed/performed              Past Medical History:  Diagnosis Date   Adenomatous colon polyp    Anxiety    BPH (benign prostatic hyperplasia)    Hypercholesteremia    Hypothyroidism    Internal hemorrhoid    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/15/2020   Past Surgical History:  Procedure Laterality Date   COLONOSCOPY     HEMORRHOID BANDING  2019   Via sigmoidoscopy   SIGMOIDOSCOPY     TONSILLECTOMY     as a child   VASECTOMY     Patient Active Problem List   Diagnosis Date Noted   Hyperlipidemia LDL goal <70 04/16/2023   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   Primary hypertension 04/16/2023   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/15/2020   Prolapsed internal hemorrhoids, grade 2 04/04/2020   Fecal smearing 04/04/2020    PCP: Imelda Man MD  REFERRING PROVIDER: Garlan Juniper MD   REFERRING DIAG:  Diagnosis  M79.601 (ICD-10-CM) - Right arm pain    THERAPY DIAG:  Acute pain of right shoulder  Rationale for Evaluation and Treatment: Rehabilitation  ONSET DATE: 2 months prior   SUBJECTIVE:                                                                                                                                                                                      SUBJECTIVE STATEMENT: 3/31 Pt reported stiffness this morning that went away once he got up and moving. Has pain in shoulder flexion which he describes as sharp and nervy.     Approximately 2 months ago the patient was ice-skating and fell onto his right shoulder.  At that point he had a progressive onset of significant pain in his shoulder.  Since that time the pain is improved.  He is now just having positional pain.  His most of his pain when he is reaching overhead.  The MD did ultrasound that showed no rotator cuff tear.  He was diagnosed with Buriev bursitis.  An x-ray also showed minor  right shoulder OA.  Patient comes to the gym 2-3 times a week. Hand dominance: Right  PERTINENT HISTORY: Anxiety   PAIN:  Are you having pain? Yes: NPRS scale: 6-7/10 Pain location: right anterior shoulder  Pain description: aching   Aggravating factors: Reaching overhead. Relieving factors: Not putting in to certain positions.  PRECAUTIONS: None  RED FLAGS: None   WEIGHT BEARING RESTRICTIONS: No  FALLS:  Has patient fallen in last 6 months? Yes ice skating   LIVING ENVIRONMENT: Lives with:  OCCUPATION: Retired    PLOF: Independent  PATIENT GOALS:  To have less pain in the shoulder   NEXT MD VISIT:    OBJECTIVE:  Note: Objective measures were completed at Evaluation unless otherwise noted.  DIAGNOSTIC FINDINGS:  X-ray: Minor OA Diagnostic Limited MSK Ultrasound of: Right shoulder Biceps tendon intact some hypoechoic fluid surrounds tendon within tendon sheath. Subscapularis tendon is intact. Supraspinatus tendon is intact without visible retracted tear. Mild subacromial bursitis is present. Infraspinatus tendon is intact. AC joint effusion is present. Impression: Bursitis   PATIENT SURVEYS :  Quick Dash 22/55  COGNITION: Overall cognitive status: Within functional limits for tasks assessed     SENSATION: WFL  POSTURE: Good posture   UPPER EXTREMITY ROM:   Active ROM Right eval Left eval  Shoulder flexion Pain past 90 degrees WNL  Shoulder extension    Shoulder abduction    Shoulder adduction    Shoulder internal rotation  Pain reaching to WNL  Shoulder external rotation Pain reaching behind his head WNL  Elbow flexion    Elbow extension    Wrist flexion    Wrist extension    Wrist ulnar deviation    Wrist radial deviation    Wrist pronation    Wrist supination    (Blank rows = not tested)  UPPER EXTREMITY MMT:  MMT Right eval Left eval  Shoulder flexion 20.9 mild pin  21  Shoulder extension    Shoulder abduction    Shoulder adduction    Shoulder internal rotation 20.1 20.5  Shoulder external rotation 15.5 22.2  Middle trapezius    Lower trapezius    Elbow flexion    Elbow extension    Wrist flexion    Wrist extension    Wrist ulnar deviation    Wrist radial deviation    Wrist pronation    Wrist supination    Grip strength (lbs)    (Blank rows = not tested)    PALPATION:  Mild tightness of the upper trap but equal                                                                                                                              TREATMENT DATE:  5/12 Manual: All PROM performed with distraction to reduce pain and improve movement Skilled palpation of trigger points.  Review of self trigger point release to the upper trap.  Grade 3 and 4 posterior and inferior  mobilizations of the shoulder.  There-ex:  UBE 2 min fwd 2 min back Reviewed patients full HEP    Nuero-re-ed  ABC 1x no weight  SIDe lying ER 3x10 \ Pallof press 3x10 each side   Reviewed other gym exercises he is perfroming. Advised not to do the straight elbow rotation.      3/31 Manual: All PROM performed with distraction to reduce pain and improve movement Skilled palpation of trigger points.  Review of self trigger point release to the upper trap.  Grade 3 and 4 posterior and inferior mobilizations of the shoulder. PROM There-ex: Tricep ext with rope 3x10 RPE 5 Standing cable chops 3x10 20lbs RPE 5 Standing cable rows straight bar 3x10 Standing shoulder FLEX 3x10 with towel  on wall Standing shoulder ABD 3x10 with towel on wall AAROM shoulder ABD with bar/golf club 3x10   Eval:  Exercises - Seated Upper Trapezius Stretch  - 1 x daily - 7 x weekly - 1 sets - 3 reps - 15-20 hold - Doorway Pec Stretch at 90 Degrees Abduction  - 1 x daily - 7 x weekly - 3 sets - 3 reps - 15-20 hold - Sidelying Shoulder External Rotation  - 1 x daily - 7 x weekly - 3 sets - 10 reps - Shoulder extension with resistance - Neutral  - 1 x daily - 7 x weekly - 3 sets - 10 reps - Scapular Retraction with Resistance  - 1 x daily - 7 x weekly - 3 sets - 10 reps - Standing Cable Chops  - 1 x daily - 7 x weekly - 3 sets - 10 reps    PATIENT EDUCATION: Education details: HEP, symptom management  Person educated: Patient Education method: Explanation, Demonstration, Tactile cues, Verbal cues, and Handouts Education comprehension: verbalized understanding, returned demonstration, verbal cues required, tactile cues required, and needs further education  HOME EXERCISE PROGRAM: Access Code: [email or web address] URL: [email or web address] Date: 08/29/2023 Prepared by: Vertis Gosselin  ASSESSMENT:  CLINICAL IMPRESSION:  The patient tolerated treatment well. We focused on manual therapy to improve overhead flexion. He still has a limitation at apporox 110 degrees. He had an improvement with manual therapy but he was still limited. We worked on supine endurance exercises and we reviewed grading of his side lying ER. We also brought him out into the gym and reviewed his exercises. He was doing a core exercise that brought his shoulder into adduction. He reported pain. He was shown a variation of the same exercise that is less movement. He tolerated that better. We reviewed his HEP and how to use it. He reported he feels like sometimes it is a nerve. We palpated his neck and felt no significant trigger points or areas of soreness. Overall the patients active motion has improved. He is having less  pain, although he is still having pain at night. He is back to everything in the gym but overhead lifting. He would beneift from skilled therapy.    Eval: Patient is a 73 year old male with acute onset of right shoulder pain approximately 2 months ago following a fall on the ice rink.  Since that point.  His pain is decreased.  He is only having pain with certain positions.  He has increased pain with overhead reaching and increased pain reaching behind his head.  He has no significant tenderness to palpation on his biceps or with compression of his AC joint at this time.  He does have strength limitation with external  rotation of about 25%.  Therapy performed trial of manual therapy with posterior glides and inferior glides.  Following he had improved pain-free active shoulder flexion.  We reviewed the gym program that he is currently on.  We reviewed exercises to avoid for right now.  We also reviewed using RPE to grade his exercises.  He tolerated well.  He will work on his exercises for 2 weeks then we will follow-up for adjust exercises as needed.  OBJECTIVE IMPAIRMENTS: decreased ROM, decreased strength, impaired UE functional use, and pain.   ACTIVITY LIMITATIONS: carrying, lifting, and reach over head  PARTICIPATION LIMITATIONS: going to the gym; community activity   PERSONAL FACTORS: 1 comorbidity: anxiety  are also affecting patient's functional outcome.   REHAB POTENTIAL: Excellent  CLINICAL DECISION MAKING: Stable/uncomplicated  EVALUATION COMPLEXITY: Low  GOALS: Goals reviewed with patient? Yes  SHORT TERM GOALS: Target date: 10/24/2023    Patient will increase right ER by 5 lbs  Baseline: Goal status: INITIAL  2.  Patient will demonstrate full right shoulder flexion without pain Baseline:  Goal status: INITIAL  3.  Patient will be independent with basic HEP Baseline:  Goal status: INITIAL  LONG TERM GOALS: Target date: 10/24/2023    Return to full gym program  without any pain Baseline:  Goal status: INITIAL  2.  Patient will reach behind his head without any pain Baseline:  Goal status: INITIAL  3.  Patient will reach overhead to shelf with 2 pound weight without pain in order to perform ADLs Baseline:  Goal status: INITIAL  PLAN: PT FREQUENCY: 1x/week  PT DURATION: 8 weeks  PLANNED INTERVENTIONS: 97110-Therapeutic exercises, 97530- Therapeutic activity, [id number]- Neuromuscular re-education, 97535- Self Care, [id number]- Manual therapy, [id number]- Gait training, [id number]- Electrical stimulation (unattended), 97035- Ultrasound, Patient/Family education, Taping, Dry Needling, DME instructions, Cryotherapy, and Moist heat   PLAN FOR NEXT SESSION:   Continue with anterior posterior and inferior glides.  Review gym program.  Progress program as tolerated.  Consider standing forward flexion and scaption in the mirror.  Review free weights.  Consider review of bicep curls triceps and any exercise he wants to do in the gym.   Kitty Perkins, PT 10/28/2023, 12:52 PM

## 2023-10-27 NOTE — Assessment & Plan Note (Signed)
 Most recent LDL was 71 in August 2024. -Continue Lipitor 20 mg daily.

## 2023-10-27 NOTE — Assessment & Plan Note (Signed)
 Aortic dilation at 3.9 cm, likely normal for him. No intervention needed. - Recheck with CT scan in two years if stable.

## 2023-10-27 NOTE — Assessment & Plan Note (Addendum)
 Blood pressure elevated, likely white coat syndrome. Home readings 130s-140s. Discussed starting low-dose antihypertensive to manage hypertension and reduce aortic dilation and coronary disease risk. Explained side effects and monitoring needs. - Recheck blood pressure before leaving. - Start valsartan  HCTZ 80/12.5 mg if elevated. - Order basic metabolic panel post-medication start.

## 2023-10-28 ENCOUNTER — Encounter (HOSPITAL_BASED_OUTPATIENT_CLINIC_OR_DEPARTMENT_OTHER): Payer: Self-pay | Admitting: Physical Therapy

## 2023-11-12 ENCOUNTER — Ambulatory Visit: Payer: Self-pay | Admitting: Cardiology

## 2023-11-12 LAB — BASIC METABOLIC PANEL WITH GFR
BUN/Creatinine Ratio: 28 — ABNORMAL HIGH (ref 10–24)
BUN: 33 mg/dL — ABNORMAL HIGH (ref 8–27)
CO2: 20 mmol/L (ref 20–29)
Calcium: 9.2 mg/dL (ref 8.6–10.2)
Chloride: 102 mmol/L (ref 96–106)
Creatinine, Ser: 1.19 mg/dL (ref 0.76–1.27)
Glucose: 111 mg/dL — ABNORMAL HIGH (ref 70–99)
Potassium: 4.7 mmol/L (ref 3.5–5.2)
Sodium: 142 mmol/L (ref 134–144)
eGFR: 64 mL/min/{1.73_m2} (ref >59)

## 2023-11-13 ENCOUNTER — Encounter (HOSPITAL_BASED_OUTPATIENT_CLINIC_OR_DEPARTMENT_OTHER): Payer: Self-pay

## 2023-11-13 ENCOUNTER — Ambulatory Visit (HOSPITAL_BASED_OUTPATIENT_CLINIC_OR_DEPARTMENT_OTHER): Payer: Self-pay | Admitting: Physical Therapy

## 2023-11-14 NOTE — Telephone Encounter (Signed)
 BUN it is only mildly elevated, which is causing slight increase in BUN/creatinine ratio.  This could occur even with mild dehydration, but overall, it is not significantly abnormal.  Especially with recent addition of valsartan  hydrochlorothiazide , it is important to continue liberal hydration.  However, overall, I would not be concerned with this slight abnormality.  Renal function is still very well preserved.  Thanks MJP

## 2023-12-02 ENCOUNTER — Ambulatory Visit (HOSPITAL_BASED_OUTPATIENT_CLINIC_OR_DEPARTMENT_OTHER): Payer: Self-pay | Attending: Family Medicine | Admitting: Physical Therapy

## 2023-12-02 ENCOUNTER — Encounter (HOSPITAL_BASED_OUTPATIENT_CLINIC_OR_DEPARTMENT_OTHER): Payer: Self-pay | Admitting: Physical Therapy

## 2023-12-02 DIAGNOSIS — M25511 Pain in right shoulder: Secondary | ICD-10-CM | POA: Insufficient documentation

## 2023-12-02 NOTE — Therapy (Signed)
 OUTPATIENT PHYSICAL THERAPY UPPER EXTREMITY EVALUATION   Patient Name: Rodney Mcdaniel MRN: [id number] DOB:[birth year], 73 y.o., male Today's Date: 12/02/2023  END OF SESSION:  PT End of Session - 12/02/23 1523     Visit Number 4    Number of Visits 8    Date for PT Re-Evaluation 12/22/23    PT Start Time 1515    PT Stop Time 1558    PT Time Calculation (min) 43 min    Activity Tolerance Patient tolerated treatment well;No increased pain    Behavior During Therapy [facility] for tasks assessed/performed           Past Medical History:  Diagnosis Date   Adenomatous colon polyp    Anxiety    BPH (benign prostatic hyperplasia)    Hypercholesteremia    Hypothyroidism    Internal hemorrhoid    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/15/2020   Past Surgical History:  Procedure Laterality Date   COLONOSCOPY     HEMORRHOID BANDING  2019   Via sigmoidoscopy   SIGMOIDOSCOPY     TONSILLECTOMY     as a child   VASECTOMY     Patient Active Problem List   Diagnosis Date Noted   Hyperlipidemia LDL goal <70 04/16/2023   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   Primary hypertension 04/16/2023   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/15/2020   Prolapsed internal hemorrhoids, grade 2 04/04/2020   Fecal smearing 04/04/2020    PCP: Imelda Man MD  REFERRING PROVIDER: Garlan Juniper MD   REFERRING DIAG:  Diagnosis  M79.601 (ICD-10-CM) - Right arm pain    THERAPY DIAG:  Acute pain of right shoulder  Rationale for Evaluation and Treatment: Rehabilitation  ONSET DATE: 2 months prior   SUBJECTIVE:                                                                                                                                                                                      SUBJECTIVE STATEMENT: The patient reports overall it is betrter. He is still having a shooting pain when he reaches at times but it is better. He is having some trouble abducting his arm.     Approximately 2 months ago the patient was ice-skating and fell onto his right shoulder.  At that point he had a progressive onset of significant pain in his shoulder.  Since that time the pain is improved.  He is now just having positional pain.  His most of his pain when he is reaching overhead.  The MD did ultrasound that showed no rotator cuff tear.  He was diagnosed with Buriev bursitis.  An x-ray also showed  minor right shoulder OA.  Patient comes to the gym 2-3 times a week. Hand dominance: Right  PERTINENT HISTORY: Anxiety   PAIN:  Are you having pain? Yes: NPRS scale: 6-7/10 Pain location: right anterior shoulder  Pain description: aching   Aggravating factors: Reaching overhead. Relieving factors: Not putting in to certain positions.  PRECAUTIONS: None  RED FLAGS: None   WEIGHT BEARING RESTRICTIONS: No  FALLS:  Has patient fallen in last 6 months? Yes ice skating   LIVING ENVIRONMENT: Lives with:  OCCUPATION: Retired    PLOF: Independent  PATIENT GOALS:  To have less pain in the shoulder   NEXT MD VISIT:    OBJECTIVE:  Note: Objective measures were completed at Evaluation unless otherwise noted.  DIAGNOSTIC FINDINGS:  X-ray: Minor OA Diagnostic Limited MSK Ultrasound of: Right shoulder Biceps tendon intact some hypoechoic fluid surrounds tendon within tendon sheath. Subscapularis tendon is intact. Supraspinatus tendon is intact without visible retracted tear. Mild subacromial bursitis is present. Infraspinatus tendon is intact. AC joint effusion is present. Impression: Bursitis   PATIENT SURVEYS :  Quick Dash 22/55  COGNITION: Overall cognitive status: Within functional limits for tasks assessed     SENSATION: WFL  POSTURE: Good posture   UPPER EXTREMITY ROM:   Active ROM Right eval Left eval  Shoulder flexion Pain past 90 degrees WNL  Shoulder extension    Shoulder abduction    Shoulder adduction    Shoulder internal rotation  Pain reaching to WNL  Shoulder external rotation Pain reaching behind his head WNL  Elbow flexion    Elbow extension    Wrist flexion    Wrist extension    Wrist ulnar deviation    Wrist radial deviation    Wrist pronation    Wrist supination    (Blank rows = not tested)  UPPER EXTREMITY MMT:  MMT Right eval Left eval  Shoulder flexion 20.9 mild pin  21  Shoulder extension    Shoulder abduction    Shoulder adduction    Shoulder internal rotation 20.1 20.5  Shoulder external rotation 15.5 22.2  Middle trapezius    Lower trapezius    Elbow flexion    Elbow extension    Wrist flexion    Wrist extension    Wrist ulnar deviation    Wrist radial deviation    Wrist pronation    Wrist supination    Grip strength (lbs)    (Blank rows = not tested)    PALPATION:  Mild tightness of the upper trap but equal                                                                                                                              TREATMENT DATE:    6/17 Manual:All PROM performed with distraction to reduce pain and improve movement Skilled palpation of trigger points.  Review of self trigger point release to the upper trap.  Grade 3 and 4 posterior and  inferior mobilizations of the shoulder.  Nuero-re-ed  ABC 1 lb and 1lb 2lb put him at a 6 in RPE   Side lying ER 3x10 5 lbs RPE of 6 by the end   There-act  Standing flexion 1x10 1 lb RPE of 3 2x10 3lbs RPE of 6  Standing scaption 1x10 1 lb RPE of 3 2x10 3lbs RPE of 6  All performed in mirror to improve functional reach   There-ex:  UBE 3 min fwd 2 min back  5/12 Manual: All PROM performed with distraction to reduce pain and improve movement Skilled palpation of trigger points.  Review of self trigger point release to the upper trap.  Grade 3 and 4 posterior and inferior mobilizations of the shoulder.  There-ex:  UBE 2 min fwd 2 min back Reviewed patients full HEP    Nuero-re-ed  ABC 1x no weight  SIDe  lying ER 3x10 \ Pallof press 3x10 each side   Reviewed other gym exercises he is perfroming. Advised not to do the straight elbow rotation.      3/31 Manual: All PROM performed with distraction to reduce pain and improve movement Skilled palpation of trigger points.  Review of self trigger point release to the upper trap.  Grade 3 and 4 posterior and inferior mobilizations of the shoulder. PROM There-ex: Tricep ext with rope 3x10 RPE 5 Standing cable chops 3x10 20lbs RPE 5 Standing cable rows straight bar 3x10 Standing shoulder FLEX 3x10 with towel on wall Standing shoulder ABD 3x10 with towel on wall AAROM shoulder ABD with bar/golf club 3x10   Eval:  Exercises - Seated Upper Trapezius Stretch  - 1 x daily - 7 x weekly - 1 sets - 3 reps - 15-20 hold - Doorway Pec Stretch at 90 Degrees Abduction  - 1 x daily - 7 x weekly - 3 sets - 3 reps - 15-20 hold - Sidelying Shoulder External Rotation  - 1 x daily - 7 x weekly - 3 sets - 10 reps - Shoulder extension with resistance - Neutral  - 1 x daily - 7 x weekly - 3 sets - 10 reps - Scapular Retraction with Resistance  - 1 x daily - 7 x weekly - 3 sets - 10 reps - Standing Cable Chops  - 1 x daily - 7 x weekly - 3 sets - 10 reps    PATIENT EDUCATION: Education details: HEP, symptom management  Person educated: Patient Education method: Explanation, Demonstration, Tactile cues, Verbal cues, and Handouts Education comprehension: verbalized understanding, returned demonstration, verbal cues required, tactile cues required, and needs further education  HOME EXERCISE PROGRAM: Access Code: [email or web address] URL: [email or web address] Date: 08/29/2023 Prepared by: Vertis Gosselin  ASSESSMENT:  CLINICAL IMPRESSION:  The patient is making progress. He had some points in his flexion arc and abduction arc that caused him pain. It improved with manual therapy but was still present. We reviewed functional reaching and  advanced some of the weights with his exercise. We will continue to progress his weights as tolerated.   Eval: Patient is a 73 year old male with acute onset of right shoulder pain approximately 2 months ago following a fall on the ice rink.  Since that point.  His pain is decreased.  He is only having pain with certain positions.  He has increased pain with overhead reaching and increased pain reaching behind his head.  He has no significant tenderness to palpation on his biceps or with compression of his AC joint  at this time.  He does have strength limitation with external rotation of about 25%.  Therapy performed trial of manual therapy with posterior glides and inferior glides.  Following he had improved pain-free active shoulder flexion.  We reviewed the gym program that he is currently on.  We reviewed exercises to avoid for right now.  We also reviewed using RPE to grade his exercises.  He tolerated well.  He will work on his exercises for 2 weeks then we will follow-up for adjust exercises as needed.  OBJECTIVE IMPAIRMENTS: decreased ROM, decreased strength, impaired UE functional use, and pain.   ACTIVITY LIMITATIONS: carrying, lifting, and reach over head  PARTICIPATION LIMITATIONS: going to the gym; community activity   PERSONAL FACTORS: 1 comorbidity: anxiety  are also affecting patient's functional outcome.   REHAB POTENTIAL: Excellent  CLINICAL DECISION MAKING: Stable/uncomplicated  EVALUATION COMPLEXITY: Low  GOALS: Goals reviewed with patient? Yes  SHORT TERM GOALS: Target date: 10/24/2023    Patient will increase right ER by 5 lbs  Baseline: Goal status: INITIAL  2.  Patient will demonstrate full right shoulder flexion without pain Baseline:  Goal status: INITIAL  3.  Patient will be independent with basic HEP Baseline:  Goal status: INITIAL  LONG TERM GOALS: Target date: 10/24/2023    Return to full gym program without any pain Baseline:  Goal status:  INITIAL  2.  Patient will reach behind his head without any pain Baseline:  Goal status: INITIAL  3.  Patient will reach overhead to shelf with 2 pound weight without pain in order to perform ADLs Baseline:  Goal status: INITIAL  PLAN: PT FREQUENCY: 1x/week  PT DURATION: 8 weeks  PLANNED INTERVENTIONS: 97110-Therapeutic exercises, 97530- Therapeutic activity, [id number]- Neuromuscular re-education, 97535- Self Care, [id number]- Manual therapy, [id number]- Gait training, [id number]- Electrical stimulation (unattended), 97035- Ultrasound, Patient/Family education, Taping, Dry Needling, DME instructions, Cryotherapy, and Moist heat   PLAN FOR NEXT SESSION:   Continue with anterior posterior and inferior glides.  Review gym program.  Progress program as tolerated.  Consider standing forward flexion and scaption in the mirror.  Review free weights.  Consider review of bicep curls triceps and any exercise he wants to do in the gym.   Kitty Perkins, PT 12/02/2023, 3:24 PM

## 2023-12-03 ENCOUNTER — Encounter (HOSPITAL_BASED_OUTPATIENT_CLINIC_OR_DEPARTMENT_OTHER): Payer: Self-pay | Admitting: Physical Therapy

## 2024-01-21 ENCOUNTER — Encounter: Payer: Self-pay | Admitting: Cardiology

## 2024-01-22 NOTE — Telephone Encounter (Signed)
 Lets see what how he does if we take away the HCTZ portion and just use the valsartan  only.  The blood pressures look great.

## 2024-01-23 MED ORDER — VALSARTAN 80 MG PO TABS: 80.0000 mg | ORAL_TABLET | Freq: Every day | ORAL | 3 refills | Status: AC

## 2024-02-06 LAB — LAB REPORT - SCANNED
EGFR: 73
TSH: 5.2

## 2024-02-24 ENCOUNTER — Other Ambulatory Visit: Payer: Self-pay

## 2024-02-24 ENCOUNTER — Encounter: Payer: Self-pay | Admitting: Family Medicine

## 2024-02-24 ENCOUNTER — Ambulatory Visit: Admitting: Family Medicine

## 2024-02-24 VITALS — BP 132/84 | HR 60 | Ht 71.0 in | Wt 218.0 lb

## 2024-02-24 DIAGNOSIS — M25511 Pain in right shoulder: Secondary | ICD-10-CM | POA: Diagnosis not present

## 2024-02-24 DIAGNOSIS — G8929 Other chronic pain: Secondary | ICD-10-CM | POA: Diagnosis not present

## 2024-02-24 NOTE — Patient Instructions (Addendum)
 Thank you for coming in today.   We are ordering an MRI for you today.  The imaging office will be calling you to schedule your appointment after we obtain authorization from your insurance company.  If you have not heard from [facility] within a week, please call [phone] to schedule your MRI.   Please be sure you have signed up for MyChart so that we can get your results to you.  We will be in touch with you as soon as we can.  Please know, it can take up to 3-4 business days for the radiologist and Dr. Joane to have time to review the results and determine the best plan of care.  If there is something that appears to be surgical or needs a referral to other specialists we will let you know through MyChart or telephone.  Otherwise we will plan to schedule a follow up appointment with Dr. Joane once we have the results.    See you back for MRI review.

## 2024-02-24 NOTE — Progress Notes (Unsigned)
   I, Leotis Batter, CMA acting as a scribe for Artist Lloyd, MD.  Rodney Mcdaniel is a 73 y.o. male who presents to [facility] at [facility] today for cont'd R shoulder pain. Pt was last seen by Dr. Lloyd on 09/24/23 and was advised to finish out PT, and cont HEP.  Today, pt reports improvement of shoulder sx with PT and HEP. Continues to have twinge of pain at lateral aspect with certain movements. No meds for shoulder sx. Denies n/t/w or decreased grip strength. Denies neck pain.   Dx imaging: 08/05/23 R shoulder XR   Pertinent review of systems: No fevers or chills  Relevant historical information: Hyperlipidemia   Exam:  BP 132/84   Pulse 60   Ht 5' 11 (1.803 m)   Wt 218 lb (98.9 kg)   SpO2 98%   BMI 30.40 kg/m  General: Well Developed, well nourished, and in no acute distress.   MSK: Right shoulder normal-appearing normal motion pain with abduction.  Strength mildly reduced abduction.     Assessment and Plan: 73 y.o. male with chronic right shoulder pain ongoing for about 6 months.  Patient has tried physical therapy with has helped but not sufficiently.  He is still experiencing a reasonable amount of pain.  Plan for MRI shoulder to further characterize source of pain and for either surgical or injection planning.   PDMP not reviewed this encounter. Orders Placed This Encounter  Procedures   MR SHOULDER RIGHT WO CONTRAST    Standing Status:   Future    Expiration Date:   02/23/2025    What is the patient's sedation requirement?:   No Sedation    Does the patient have a pacemaker or implanted devices?:   No    Preferred imaging location?:   [facility] (table limit-550lbs)   No orders of the defined types were placed in this encounter.    Discussed warning signs or symptoms. Please see discharge instructions. Patient expresses understanding.   The above documentation has been reviewed and is accurate and complete Artist Lloyd, M.D.

## 2024-03-10 ENCOUNTER — Ambulatory Visit
Admission: RE | Admit: 2024-03-10 | Discharge: 2024-03-10 | Disposition: A | Discharge status: Home / Self Care | Source: Ambulatory Visit | Attending: Family Medicine | Admitting: Family Medicine

## 2024-03-10 DIAGNOSIS — G8929 Other chronic pain: Secondary | ICD-10-CM

## 2024-03-12 ENCOUNTER — Ambulatory Visit: Payer: Self-pay | Admitting: Family Medicine

## 2024-03-12 NOTE — Progress Notes (Signed)
 Right shoulder MRI shows rotator cuff tendinitis without tear which is great news.  I think an injection is worth trying at this point.  Injection could be helpful. Please schedule an appointment with me to discuss the MRI in full detail and proceed with injection.

## 2024-03-22 ENCOUNTER — Ambulatory Visit (INDEPENDENT_AMBULATORY_CARE_PROVIDER_SITE_OTHER): Admitting: Family Medicine

## 2024-03-22 ENCOUNTER — Other Ambulatory Visit: Payer: Self-pay

## 2024-03-22 VITALS — BP 158/82 | HR 63 | Ht 71.0 in | Wt 218.0 lb

## 2024-03-22 DIAGNOSIS — G8929 Other chronic pain: Secondary | ICD-10-CM

## 2024-03-22 DIAGNOSIS — M25511 Pain in right shoulder: Secondary | ICD-10-CM

## 2024-03-22 NOTE — Progress Notes (Signed)
 LILLETTE Ileana Collet, PhD, LAT, ATC acting as a scribe for Artist Lloyd, MD.  Rodney Mcdaniel is a 73 y.o. male who presents to [facility] at [facility] today for f/u R shoulder pain w/ MRI review. Pt was last seen by Dr. Lloyd on 02/24/24 and a MRI was ordered.  Today, pt reports R shoulder is feeling pretty good. Pain only w/ aBd over 90 degrees.  He can do some activities such as exercise normally with his arm above the shoulder because of his shoulder pain.  Dx imaging: 03/10/24 R shoulder MRI 08/05/23 R shoulder XR   Pertinent review of systems: No fevers or chills  Relevant historical information: Hypertension   Exam:  BP (!) 158/82   Pulse 63   Ht 5' 11 (1.803 m)   Wt 218 lb (98.9 kg)   SpO2 97%   BMI 30.40 kg/m  General: Well Developed, well nourished, and in no acute distress.   MSK: Right shoulder normal-appearing decreased abduction range of motion with pain.    Lab and Radiology Results  Procedure: Real-time Ultrasound Guided Injection of right shoulder subacromial bursa Device: Philips Affiniti 50G/GE Logiq Images permanently stored and available for review in PACS Verbal informed consent obtained.  Discussed risks and benefits of procedure. Warned about infection, bleeding, hyperglycemia damage to structures among others. Patient expresses understanding and agreement Time-out conducted.   Noted no overlying erythema, induration, or other signs of local infection.   Skin prepped in a sterile fashion.   Local anesthesia: Topical Ethyl chloride.   With sterile technique and under real time ultrasound guidance: 40 mg of Kenalog and 2 mL of Marcaine injected into subacromial bursa. Fluid seen entering the bursa.   Completed without difficulty   Pain immediately resolved suggesting accurate placement of the medication.   Advised to call if fevers/chills, erythema, induration, drainage, or persistent bleeding.   Images permanently stored and available  for review in the ultrasound unit.  Impression: Technically successful ultrasound guided injection.    MR SHOULDER WITHOUT IV CONTRAST RIGHT   COMPARISON: None.   CLINICAL HISTORY: Shoulder pain, chronic rotator cuff disorder.   PULSE SEQUENCES: Ax PD FS, Sag T2 FS, Cor T1 & COR T2 FS   FINDINGS:   Bones: There is moderate AC joint arthrosis with hypertrophy of the [facility] joint reactive edema. Mild glenohumeral arthrosis.   Rotator cuff: There is insertional tendinosis of the anterior supraspinatus tendon with a moderate partial tear best seen on coronal image 14 and 15. No full-thickness or retracted tear is present. The infraspinatus tendon is intact. Subscapularis and teres minor tendons are intact. There is no significant fatty atrophy of the rotator cuff muscles.   Labrum and biceps tendon: There is mild tendinosis of the intra-articular segment of biceps tendon. No significant tear is present. There is blunting of the superior labrum, anterior labrum and posterior labrum. No displaced labral tear is present.     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   Moderate insertional tendinosis of the anterior supraspinatus tendon with partial tear. No retracted or significant full-thickness tear is appreciated.   Mild tendinosis of the intra-articular segment of biceps tendon without a tear.   Blunting of the labrum without discrete labral tear.   Electronically signed by: Norleen Satchel MD 03/11/2024 03:23 PM EDT RP Workstation: [id number]   LILLETTE Artist Lloyd, personally (independently) visualized and performed the interpretation of the images attached in this note.     Assessment and Plan: 73  y.o. male with right shoulder pain due to subacromial impingement bursitis and tendinitis.  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  Plan for subacromial injection and continue home exercise program previously taught with PT.  Check back with me as needed.   PDMP not  reviewed this encounter. Orders Placed This Encounter  Procedures   US  LIMITED JOINT SPACE STRUCTURES UP RIGHT(NO LINKED CHARGES)    Reason for Exam (SYMPTOM  OR DIAGNOSIS REQUIRED):   right shoulder pain    Preferred imaging location?:   [facility]   No orders of the defined types were placed in this encounter.    Discussed warning signs or symptoms. Please see discharge instructions. Patient expresses understanding.   The above documentation has been reviewed and is accurate and complete Artist Lloyd, M.D.

## 2024-03-22 NOTE — Patient Instructions (Addendum)
 Thank you for coming in today.   You received an injection today. Seek immediate medical attention if the joint becomes red, extremely painful, or is oozing fluid.   Continue working on exercises  Check back as needed

## 2024-05-10 ENCOUNTER — Encounter: Payer: Self-pay | Admitting: Cardiology

## 2024-05-10 DIAGNOSIS — R030 Elevated blood-pressure reading, without diagnosis of hypertension: Secondary | ICD-10-CM

## 2024-05-10 DIAGNOSIS — I10 Essential (primary) hypertension: Secondary | ICD-10-CM

## 2024-05-15 NOTE — Telephone Encounter (Signed)
 The very fact that the blood pressure was in normal range and this was occurring, and it happened while you are sleeping is unlikely that is with the blood pressure.  As for the dry mouth, add not a usual side effect of the blood pressure medication that was started.  Metoprolol is not generally as potent for controlling blood pressure is 1 we started is.  Before we made adjustments, be prudent to actually review what your blood pressure been doing in order to figure out other options.    3 need to review the medications that you are taking in order to see if there is any other potential culprits for the dry mouth.  My availability for clinic days in the next few weeks is limited, but perhaps we can have you come in and meet with my clinical pharmacist to look at your blood pressure medication regimen and your medications to see if anything can explain the dry mouth, and if so make some adjustments.  We will try to get you in to see our Clinical Pharmacists (who run our [facility] -- located on the same floor - 5th floor- as our new office on [address]).   Alm Clay, MD

## 2024-07-19 ENCOUNTER — Ambulatory Visit: Admitting: Pharmacist Clinician (PhC)/ Clinical Pharmacy Specialist
